# Patient Record
Sex: Male | Born: 1985 | Hispanic: Yes | Smoking: Never smoker
Health system: Southern US, Community
[De-identification: ages and names within clinical notes are randomized; demographics above are authoritative.]

## PROBLEM LIST (undated history)

## (undated) DIAGNOSIS — N200 Calculus of kidney: Secondary | ICD-10-CM

## (undated) DIAGNOSIS — L0291 Cutaneous abscess, unspecified: Secondary | ICD-10-CM

## (undated) HISTORY — PX: APPENDECTOMY: SHX54

---

## 2014-12-12 ENCOUNTER — Encounter (HOSPITAL_COMMUNITY): Payer: Self-pay | Admitting: Emergency Medicine

## 2014-12-12 DIAGNOSIS — T814XXA Infection following a procedure, initial encounter: Secondary | ICD-10-CM | POA: Diagnosis not present

## 2014-12-12 DIAGNOSIS — K750 Abscess of liver: Secondary | ICD-10-CM | POA: Diagnosis not present

## 2014-12-12 DIAGNOSIS — K352 Acute appendicitis with generalized peritonitis: Principal | ICD-10-CM | POA: Diagnosis present

## 2014-12-12 DIAGNOSIS — A419 Sepsis, unspecified organism: Secondary | ICD-10-CM | POA: Diagnosis not present

## 2014-12-12 DIAGNOSIS — K381 Appendicular concretions: Secondary | ICD-10-CM | POA: Diagnosis present

## 2014-12-12 DIAGNOSIS — Z6838 Body mass index (BMI) 38.0-38.9, adult: Secondary | ICD-10-CM

## 2014-12-12 DIAGNOSIS — E669 Obesity, unspecified: Secondary | ICD-10-CM | POA: Diagnosis present

## 2014-12-12 NOTE — ED Notes (Signed)
C/o RLQ pain since yesterday.  Denies vomiting/diarrhea.

## 2014-12-13 ENCOUNTER — Inpatient Hospital Stay (HOSPITAL_COMMUNITY)
Admission: EM | Admit: 2014-12-13 | Discharge: 2014-12-28 | DRG: 335 | Disposition: A | Discharge status: Home / Self Care | Payer: Self-pay | Attending: General Surgery | Admitting: General Surgery

## 2014-12-13 ENCOUNTER — Observation Stay (HOSPITAL_COMMUNITY): Payer: Self-pay | Admitting: Certified Registered"

## 2014-12-13 ENCOUNTER — Emergency Department (HOSPITAL_COMMUNITY): Payer: Self-pay

## 2014-12-13 ENCOUNTER — Encounter (HOSPITAL_COMMUNITY): Admission: EM | Disposition: A | Discharge status: Home / Self Care | Payer: Self-pay | Source: Home / Self Care

## 2014-12-13 ENCOUNTER — Observation Stay (HOSPITAL_COMMUNITY): Payer: MEDICAID | Admitting: Certified Registered"

## 2014-12-13 ENCOUNTER — Encounter (HOSPITAL_COMMUNITY): Payer: Self-pay | Admitting: Radiology

## 2014-12-13 DIAGNOSIS — K567 Ileus, unspecified: Secondary | ICD-10-CM | POA: Diagnosis not present

## 2014-12-13 DIAGNOSIS — K3532 Acute appendicitis with perforation and localized peritonitis, without abscess: Secondary | ICD-10-CM | POA: Diagnosis present

## 2014-12-13 DIAGNOSIS — K352 Acute appendicitis with generalized peritonitis, without abscess: Secondary | ICD-10-CM

## 2014-12-13 DIAGNOSIS — R188 Other ascites: Secondary | ICD-10-CM | POA: Insufficient documentation

## 2014-12-13 DIAGNOSIS — K9189 Other postprocedural complications and disorders of digestive system: Secondary | ICD-10-CM

## 2014-12-13 HISTORY — PX: LAPAROSCOPIC APPENDECTOMY: SHX408

## 2014-12-13 LAB — CBC
HCT: 43.5 % (ref 39.0–52.0)
HEMATOCRIT: 47.1 % (ref 39.0–52.0)
HEMOGLOBIN: 16.3 g/dL (ref 13.0–17.0)
Hemoglobin: 14.7 g/dL (ref 13.0–17.0)
MCH: 27.2 pg (ref 26.0–34.0)
MCH: 27.6 pg (ref 26.0–34.0)
MCHC: 33.8 g/dL (ref 30.0–36.0)
MCHC: 34.6 g/dL (ref 30.0–36.0)
MCV: 79.8 fL (ref 78.0–100.0)
MCV: 80.4 fL (ref 78.0–100.0)
PLATELETS: 185 10*3/uL (ref 150–400)
Platelets: 209 10*3/uL (ref 150–400)
RBC: 5.41 MIL/uL (ref 4.22–5.81)
RBC: 5.9 MIL/uL — ABNORMAL HIGH (ref 4.22–5.81)
RDW: 12.8 % (ref 11.5–15.5)
RDW: 12.9 % (ref 11.5–15.5)
WBC: 17.1 10*3/uL — ABNORMAL HIGH (ref 4.0–10.5)
WBC: 17.3 10*3/uL — ABNORMAL HIGH (ref 4.0–10.5)

## 2014-12-13 LAB — COMPREHENSIVE METABOLIC PANEL
ALBUMIN: 4.6 g/dL (ref 3.5–5.0)
ALK PHOS: 59 U/L (ref 38–126)
ALT: 40 U/L (ref 17–63)
ANION GAP: 9 (ref 5–15)
AST: 23 U/L (ref 15–41)
BILIRUBIN TOTAL: 0.9 mg/dL (ref 0.3–1.2)
BUN: 8 mg/dL (ref 6–20)
CO2: 26 mmol/L (ref 22–32)
Calcium: 9.6 mg/dL (ref 8.9–10.3)
Chloride: 98 mmol/L — ABNORMAL LOW (ref 101–111)
Creatinine, Ser: 0.77 mg/dL (ref 0.61–1.24)
GFR calc Af Amer: >60 mL/min (ref >60)
GFR calc non Af Amer: >60 mL/min (ref >60)
Glucose, Bld: 135 mg/dL — ABNORMAL HIGH (ref 65–99)
POTASSIUM: 4.1 mmol/L (ref 3.5–5.1)
SODIUM: 133 mmol/L — AB (ref 135–145)
TOTAL PROTEIN: 7.8 g/dL (ref 6.5–8.1)

## 2014-12-13 LAB — URINE MICROSCOPIC-ADD ON

## 2014-12-13 LAB — URINALYSIS, ROUTINE W REFLEX MICROSCOPIC
BILIRUBIN URINE: NEGATIVE
GLUCOSE, UA: NEGATIVE mg/dL
KETONES UR: NEGATIVE mg/dL
Leukocytes, UA: NEGATIVE
NITRITE: NEGATIVE
PH: 5.5 (ref 5.0–8.0)
Protein, ur: NEGATIVE mg/dL
SPECIFIC GRAVITY, URINE: 1.031 — AB (ref 1.005–1.030)

## 2014-12-13 LAB — LIPASE, BLOOD: Lipase: 25 U/L (ref 11–51)

## 2014-12-13 LAB — SURGICAL PCR SCREEN
MRSA, PCR: NEGATIVE
Staphylococcus aureus: NEGATIVE

## 2014-12-13 SURGERY — APPENDECTOMY, LAPAROSCOPIC
Anesthesia: General | Site: Abdomen

## 2014-12-13 MED ORDER — BUPIVACAINE-EPINEPHRINE (PF) 0.25% -1:200000 IJ SOLN
INTRAMUSCULAR | Status: AC
  Filled 2014-12-13: qty 30

## 2014-12-13 MED ORDER — PROPOFOL 10 MG/ML IV BOLUS
INTRAVENOUS | Status: AC
  Filled 2014-12-13: qty 20

## 2014-12-13 MED ORDER — LACTATED RINGERS IV SOLN: INTRAVENOUS | Status: DC

## 2014-12-13 MED ORDER — MIDAZOLAM HCL 5 MG/5ML IJ SOLN
INTRAMUSCULAR | Status: DC | PRN
  Administered 2014-12-13: 2 mg via INTRAVENOUS

## 2014-12-13 MED ORDER — MORPHINE SULFATE (PF) 2 MG/ML IV SOLN
1.0000 mg | INTRAVENOUS | Status: DC | PRN
  Administered 2014-12-13: 2 mg via INTRAVENOUS
  Administered 2014-12-13: 4 mg via INTRAVENOUS
  Administered 2014-12-14 – 2014-12-18 (×4): 2 mg via INTRAVENOUS
  Administered 2014-12-20: 4 mg via INTRAVENOUS
  Filled 2014-12-13 (×3): qty 1
  Filled 2014-12-13: qty 2
  Filled 2014-12-13: qty 1
  Filled 2014-12-13: qty 2
  Filled 2014-12-13: qty 1

## 2014-12-13 MED ORDER — ACETAMINOPHEN 500 MG PO TABS
1000.0000 mg | ORAL_TABLET | Freq: Once | ORAL | Status: AC
  Administered 2014-12-13: 1000 mg via ORAL
  Filled 2014-12-13: qty 2

## 2014-12-13 MED ORDER — PIPERACILLIN-TAZOBACTAM 3.375 G IVPB
3.3750 g | Freq: Three times a day (TID) | INTRAVENOUS | Status: DC
  Administered 2014-12-13 – 2014-12-18 (×15): 3.375 g via INTRAVENOUS
  Filled 2014-12-13 (×17): qty 50

## 2014-12-13 MED ORDER — ROCURONIUM BROMIDE 100 MG/10ML IV SOLN
INTRAVENOUS | Status: DC | PRN
  Administered 2014-12-13: 20 mg via INTRAVENOUS
  Administered 2014-12-13: 10 mg via INTRAVENOUS
  Administered 2014-12-13: 40 mg via INTRAVENOUS
  Administered 2014-12-13: 30 mg via INTRAVENOUS

## 2014-12-13 MED ORDER — ONDANSETRON 4 MG PO TBDP
4.0000 mg | ORAL_TABLET | Freq: Four times a day (QID) | ORAL | Status: DC | PRN
Start: 2014-12-13 — End: 2014-12-28

## 2014-12-13 MED ORDER — ACETAMINOPHEN 325 MG PO TABS
650.0000 mg | ORAL_TABLET | Freq: Four times a day (QID) | ORAL | Status: DC | PRN
  Administered 2014-12-13 – 2014-12-16 (×3): 650 mg via ORAL
  Filled 2014-12-13 (×3): qty 2

## 2014-12-13 MED ORDER — LIDOCAINE HCL (CARDIAC) 20 MG/ML IV SOLN
INTRAVENOUS | Status: AC
Start: 2014-12-13 — End: 2014-12-13
  Filled 2014-12-13: qty 5

## 2014-12-13 MED ORDER — SODIUM CHLORIDE 0.9 % IV BOLUS (SEPSIS)
1000.0000 mL | Freq: Once | INTRAVENOUS | Status: AC
  Administered 2014-12-13: 1000 mL via INTRAVENOUS

## 2014-12-13 MED ORDER — ALBUMIN HUMAN 5 % IV SOLN
INTRAVENOUS | Status: AC
  Filled 2014-12-13: qty 250

## 2014-12-13 MED ORDER — GLYCOPYRROLATE 0.2 MG/ML IJ SOLN
INTRAMUSCULAR | Status: DC | PRN
  Administered 2014-12-13: 0.4 mg via INTRAVENOUS

## 2014-12-13 MED ORDER — ROCURONIUM BROMIDE 50 MG/5ML IV SOLN
INTRAVENOUS | Status: AC
  Filled 2014-12-13: qty 1

## 2014-12-13 MED ORDER — FENTANYL CITRATE (PF) 250 MCG/5ML IJ SOLN
INTRAMUSCULAR | Status: AC
  Filled 2014-12-13: qty 5

## 2014-12-13 MED ORDER — BUPIVACAINE-EPINEPHRINE 0.25% -1:200000 IJ SOLN
INTRAMUSCULAR | Status: DC | PRN
  Administered 2014-12-13: 13 mL

## 2014-12-13 MED ORDER — LIDOCAINE HCL (CARDIAC) 20 MG/ML IV SOLN
INTRAVENOUS | Status: DC | PRN
  Administered 2014-12-13: 70 mg via INTRAVENOUS

## 2014-12-13 MED ORDER — PROPOFOL 10 MG/ML IV BOLUS
INTRAVENOUS | Status: DC | PRN
  Administered 2014-12-13: 200 mg via INTRAVENOUS

## 2014-12-13 MED ORDER — LACTATED RINGERS IV SOLN
INTRAVENOUS | Status: DC | PRN
  Administered 2014-12-13 (×4): via INTRAVENOUS

## 2014-12-13 MED ORDER — PIPERACILLIN-TAZOBACTAM 3.375 G IVPB
3.3750 g | Freq: Three times a day (TID) | INTRAVENOUS | Status: DC
  Filled 2014-12-13 (×2): qty 50

## 2014-12-13 MED ORDER — OXYCODONE HCL 5 MG/5ML PO SOLN: 5.0000 mg | Freq: Once | ORAL | Status: DC | PRN

## 2014-12-13 MED ORDER — ALBUMIN HUMAN 5 % IV SOLN
INTRAVENOUS | Status: AC
  Administered 2014-12-13: 12.5 g
  Filled 2014-12-13: qty 250

## 2014-12-13 MED ORDER — IOHEXOL 300 MG/ML  SOLN
100.0000 mL | Freq: Once | INTRAMUSCULAR | Status: AC | PRN
  Administered 2014-12-13: 100 mL via INTRAVENOUS

## 2014-12-13 MED ORDER — ONDANSETRON HCL 4 MG/2ML IJ SOLN
4.0000 mg | Freq: Four times a day (QID) | INTRAMUSCULAR | Status: DC | PRN
  Administered 2014-12-13: 4 mg via INTRAVENOUS

## 2014-12-13 MED ORDER — HYDROMORPHONE HCL 1 MG/ML IJ SOLN: 0.2500 mg | INTRAMUSCULAR | Status: DC | PRN

## 2014-12-13 MED ORDER — FENTANYL CITRATE (PF) 100 MCG/2ML IJ SOLN
INTRAMUSCULAR | Status: DC | PRN
  Administered 2014-12-13: 50 ug via INTRAVENOUS
  Administered 2014-12-13 (×2): 100 ug via INTRAVENOUS

## 2014-12-13 MED ORDER — PIPERACILLIN-TAZOBACTAM 3.375 G IVPB 30 MIN
3.3750 g | Freq: Once | INTRAVENOUS | Status: AC
Start: 2014-12-13 — End: 2014-12-13
  Administered 2014-12-13: 3.375 g via INTRAVENOUS
  Filled 2014-12-13: qty 50

## 2014-12-13 MED ORDER — DIPHENHYDRAMINE HCL 50 MG/ML IJ SOLN: 25.0000 mg | Freq: Four times a day (QID) | INTRAMUSCULAR | Status: DC | PRN

## 2014-12-13 MED ORDER — NEOSTIGMINE METHYLSULFATE 10 MG/10ML IV SOLN
INTRAVENOUS | Status: DC | PRN
  Administered 2014-12-13: 3 mg via INTRAVENOUS

## 2014-12-13 MED ORDER — ENOXAPARIN SODIUM 40 MG/0.4ML ~~LOC~~ SOLN
40.0000 mg | SUBCUTANEOUS | Status: DC
  Administered 2014-12-13 – 2014-12-19 (×7): 40 mg via SUBCUTANEOUS
  Filled 2014-12-13 (×6): qty 0.4

## 2014-12-13 MED ORDER — ONDANSETRON HCL 4 MG/2ML IJ SOLN: 4.0000 mg | Freq: Four times a day (QID) | INTRAMUSCULAR | Status: DC | PRN

## 2014-12-13 MED ORDER — POTASSIUM CHLORIDE IN NACL 20-0.9 MEQ/L-% IV SOLN
INTRAVENOUS | Status: DC
  Administered 2014-12-13 – 2014-12-27 (×19): via INTRAVENOUS
  Filled 2014-12-13 (×27): qty 1000

## 2014-12-13 MED ORDER — MORPHINE SULFATE (PF) 4 MG/ML IV SOLN
6.0000 mg | Freq: Once | INTRAVENOUS | Status: AC
  Administered 2014-12-13: 6 mg via INTRAVENOUS
  Filled 2014-12-13: qty 2

## 2014-12-13 MED ORDER — 0.9 % SODIUM CHLORIDE (POUR BTL) OPTIME
TOPICAL | Status: DC | PRN
  Administered 2014-12-13: 1000 mL

## 2014-12-13 MED ORDER — ALBUMIN HUMAN 25 % IV SOLN
125.0000 g | Freq: Once | INTRAVENOUS | Status: AC
  Administered 2014-12-13: 125 g via INTRAVENOUS
  Filled 2014-12-13 (×2): qty 500

## 2014-12-13 MED ORDER — DIPHENHYDRAMINE HCL 25 MG PO CAPS: 25.0000 mg | ORAL_CAPSULE | Freq: Four times a day (QID) | ORAL | Status: DC | PRN

## 2014-12-13 MED ORDER — SODIUM CHLORIDE 0.9 % IR SOLN
Status: DC | PRN
  Administered 2014-12-13 (×3): 1000 mL

## 2014-12-13 MED ORDER — ALBUMIN HUMAN 5 % IV SOLN
25.0000 g | Freq: Once | INTRAVENOUS | Status: AC
  Administered 2014-12-13: 12.5 g via INTRAVENOUS

## 2014-12-13 MED ORDER — MIDAZOLAM HCL 2 MG/2ML IJ SOLN
INTRAMUSCULAR | Status: AC
  Filled 2014-12-13: qty 2

## 2014-12-13 MED ORDER — OXYCODONE HCL 5 MG PO TABS: 5.0000 mg | ORAL_TABLET | Freq: Once | ORAL | Status: DC | PRN

## 2014-12-13 SURGICAL SUPPLY — 50 items
APPLIER CLIP ROT 10 11.4 M/L (STAPLE)
BLADE SURG ROTATE 9660 (MISCELLANEOUS) ×3 IMPLANT
CANISTER SUCTION 2500CC (MISCELLANEOUS) ×3 IMPLANT
CATH FOLEY 2WAY  3CC  8FR (CATHETERS) ×2
CATH FOLEY 2WAY 3CC 8FR (CATHETERS) ×1 IMPLANT
CHLORAPREP W/TINT 26ML (MISCELLANEOUS) ×3 IMPLANT
CLIP APPLIE ROT 10 11.4 M/L (STAPLE) IMPLANT
COVER SURGICAL LIGHT HANDLE (MISCELLANEOUS) ×3 IMPLANT
CUTTER LINEAR ENDO 35 ETS (STAPLE) IMPLANT
DRAIN CHANNEL 19F RND (DRAIN) ×3 IMPLANT
ELECT REM PT RETURN 9FT ADLT (ELECTROSURGICAL) ×3
ELECTRODE REM PT RTRN 9FT ADLT (ELECTROSURGICAL) ×1 IMPLANT
EVACUATOR SILICONE 100CC (DRAIN) ×3 IMPLANT
GLOVE BIO SURGEON STRL SZ8 (GLOVE) ×6 IMPLANT
GLOVE BIOGEL PI IND STRL 7.0 (GLOVE) ×1 IMPLANT
GLOVE BIOGEL PI IND STRL 8 (GLOVE) ×2 IMPLANT
GLOVE BIOGEL PI INDICATOR 7.0 (GLOVE) ×2
GLOVE BIOGEL PI INDICATOR 8 (GLOVE) ×4
GLOVE SURG SS PI 7.5 STRL IVOR (GLOVE) ×6 IMPLANT
GOWN STRL REUS W/ TWL LRG LVL3 (GOWN DISPOSABLE) ×2 IMPLANT
GOWN STRL REUS W/ TWL XL LVL3 (GOWN DISPOSABLE) ×3 IMPLANT
GOWN STRL REUS W/TWL LRG LVL3 (GOWN DISPOSABLE) ×4
GOWN STRL REUS W/TWL XL LVL3 (GOWN DISPOSABLE) ×6
HANDLE UNIV ENDO GIA (ENDOMECHANICALS) ×3 IMPLANT
KIT BASIN OR (CUSTOM PROCEDURE TRAY) ×3 IMPLANT
KIT ROOM TURNOVER OR (KITS) ×3 IMPLANT
LIQUID BAND (GAUZE/BANDAGES/DRESSINGS) ×3 IMPLANT
NEEDLE 22X1 1/2 (OR ONLY) (NEEDLE) ×3 IMPLANT
NS IRRIG 1000ML POUR BTL (IV SOLUTION) ×3 IMPLANT
PAD ARMBOARD 7.5X6 YLW CONV (MISCELLANEOUS) ×6 IMPLANT
POUCH SPECIMEN RETRIEVAL 10MM (ENDOMECHANICALS) ×3 IMPLANT
RELOAD /EVU35 (ENDOMECHANICALS) IMPLANT
RELOAD BLUE (STAPLE) ×6 IMPLANT
RELOAD CUTTER ETS 35MM STAND (ENDOMECHANICALS) IMPLANT
RELOAD TRI 2.0 60 XTHK VAS SUL (STAPLE) ×3 IMPLANT
SCALPEL HARMONIC ACE (MISCELLANEOUS) ×3 IMPLANT
SCISSORS LAP 5X35 DISP (ENDOMECHANICALS) ×3 IMPLANT
SET IRRIG TUBING LAPAROSCOPIC (IRRIGATION / IRRIGATOR) ×3 IMPLANT
SPECIMEN JAR SMALL (MISCELLANEOUS) ×3 IMPLANT
STAPLER ECHELON FLEX (STAPLE) ×3 IMPLANT
SUT ETHILON 2 0 FS 18 (SUTURE) ×3 IMPLANT
SUT VIC AB 4-0 PS2 27 (SUTURE) ×3 IMPLANT
TOWEL OR 17X24 6PK STRL BLUE (TOWEL DISPOSABLE) IMPLANT
TOWEL OR 17X26 10 PK STRL BLUE (TOWEL DISPOSABLE) ×3 IMPLANT
TRAY FOLEY CATH 16FR SILVER (SET/KITS/TRAYS/PACK) ×3 IMPLANT
TRAY LAPAROSCOPIC MC (CUSTOM PROCEDURE TRAY) ×3 IMPLANT
TROCAR XCEL 12X100 BLDLESS (ENDOMECHANICALS) ×3 IMPLANT
TROCAR XCEL BLUNT TIP 100MML (ENDOMECHANICALS) ×3 IMPLANT
TROCAR XCEL NON-BLD 5MMX100MML (ENDOMECHANICALS) ×3 IMPLANT
TUBING INSUFFLATION (TUBING) ×3 IMPLANT

## 2014-12-13 NOTE — Anesthesia Postprocedure Evaluation (Signed)
  Anesthesia Post-op Note  Patient: Dale DieselJonathan Abarca Crane  Procedure(s) Performed: Procedure(s): APPENDECTOMY LAPAROSCOPIC FOR RUPTURED APPENDIX; LAPAROSCOPIC LYSIS OF ADHESIONS 45 minutes (N/A)  Patient Location: PACU  Anesthesia Type:General  Level of Consciousness: awake, alert  and oriented  Airway and Oxygen Therapy: Patient Spontanous Breathing and Patient connected to nasal cannula oxygen  Post-op Pain: Mild  Post-op Assessment: Post-op Vital signs reviewed, Patient's Cardiovascular Status Stable, Respiratory Function Stable, Patent Airway and Pain level controlled              Post-op Vital Signs: stable  Last Vitals:  Filed Vitals:   12/13/14 1654  BP:   Pulse:   Temp: 39.3 C  Resp:     Complications: No apparent anesthesia complications

## 2014-12-13 NOTE — H&P (Signed)
Dale Crane is an 29 y.o. male.   Chief Complaint: Right lower quadrant abdominal pain HPI: This is a pleasant Hispanic gentleman who presents with a three-day history of sharp right lower quadrant abdominal pain and fever. The pain came on gradually. In the emergency department he was found to be tachycardic and had a fever of 102. He reports having had an appendectomy in [place] approximately 3 years ago. One month after that surgery he required reoperation. He denies nausea or vomiting. Bowel movements a been normal. He is otherwise healthy without complaints.  History reviewed. No pertinent past medical history.  Past Surgical History  Procedure Laterality Date  . Appendectomy      No family history on file. Social History:  reports that he has never smoked. He does not have any smokeless tobacco history on file. He reports that he drinks alcohol. He reports that he does not use illicit drugs.  Allergies: No Known Allergies  No prescriptions prior to admission    Results for orders placed or performed during the hospital encounter of 12/13/14 (from the past 48 hour(s))  Urinalysis, Routine w reflex microscopic (not at [facility])     Status: Abnormal   Collection Time: 12/13/14 12:06 AM  Result Value Ref Range   Color, Urine AMBER (A) YELLOW    Comment: BIOCHEMICALS MAY BE AFFECTED BY COLOR   APPearance CLEAR CLEAR   Specific Gravity, Urine 1.031 (H) 1.005 - 1.030   pH 5.5 5.0 - 8.0   Glucose, UA NEGATIVE NEGATIVE mg/dL   Hgb urine dipstick TRACE (A) NEGATIVE   Bilirubin Urine NEGATIVE NEGATIVE   Ketones, ur NEGATIVE NEGATIVE mg/dL   Protein, ur NEGATIVE NEGATIVE mg/dL   Nitrite NEGATIVE NEGATIVE   Leukocytes, UA NEGATIVE NEGATIVE  Urine microscopic-add on     Status: Abnormal   Collection Time: 12/13/14 12:06 AM  Result Value Ref Range   Squamous Epithelial / LPF 0-5 (A) NONE SEEN    Comment: Please note change in reference range.   WBC, UA 0-5 0 - 5 WBC/hpf    Comment:  Please note change in reference range.   RBC / HPF 0-5 0 - 5 RBC/hpf    Comment: Please note change in reference range.   Bacteria, UA RARE (A) NONE SEEN    Comment: Please note change in reference range.   Urine-Other MUCOUS PRESENT   Lipase, blood     Status: None   Collection Time: 12/13/14 12:24 AM  Result Value Ref Range   Lipase 25 11 - 51 U/L  Comprehensive metabolic panel     Status: Abnormal   Collection Time: 12/13/14 12:24 AM  Result Value Ref Range   Sodium 133 (L) 135 - 145 mmol/L   Potassium 4.1 3.5 - 5.1 mmol/L   Chloride 98 (L) 101 - 111 mmol/L   CO2 26 22 - 32 mmol/L   Glucose, Bld 135 (H) 65 - 99 mg/dL   BUN 8 6 - 20 mg/dL   Creatinine, Ser 0.77 0.61 - 1.24 mg/dL   Calcium 9.6 8.9 - 10.3 mg/dL   Total Protein 7.8 6.5 - 8.1 g/dL   Albumin 4.6 3.5 - 5.0 g/dL   AST 23 15 - 41 U/L   ALT 40 17 - 63 U/L   Alkaline Phosphatase 59 38 - 126 U/L   Total Bilirubin 0.9 0.3 - 1.2 mg/dL   GFR calc non Af Amer >60 >60 mL/min   GFR calc Af Amer >60 >60 mL/min  Comment: (NOTE) The eGFR has been calculated using the CKD EPI equation. This calculation has not been validated in all clinical situations. eGFR's persistently <60 mL/min signify possible Chronic Kidney Disease.    Anion gap 9 5 - 15  CBC     Status: Abnormal   Collection Time: 12/13/14 12:24 AM  Result Value Ref Range   WBC 17.3 (H) 4.0 - 10.5 K/uL   RBC 5.90 (H) 4.22 - 5.81 MIL/uL   Hemoglobin 16.3 13.0 - 17.0 g/dL   HCT 47.1 39.0 - 52.0 %   MCV 79.8 78.0 - 100.0 fL   MCH 27.6 26.0 - 34.0 pg   MCHC 34.6 30.0 - 36.0 g/dL   RDW 12.8 11.5 - 15.5 %   Platelets 209 150 - 400 K/uL   Ct Abdomen Pelvis W Contrast  12/13/2014  CLINICAL DATA:  Right lower quadrant pain since yesterday, fever. Patient reports history of prior appendectomy and surgery to deal with infection in [place] earlier this year. EXAM: CT ABDOMEN AND PELVIS WITH CONTRAST TECHNIQUE: Multidetector CT imaging of the abdomen and pelvis was  performed using the standard protocol following bolus administration of intravenous contrast. CONTRAST:  170m OMNIPAQUE IOHEXOL 300 MG/ML  SOLN COMPARISON:  None. FINDINGS: Lower chest: Linear subsegmental atelectasis in the right lower lobe and lingula. Liver: Decreased density consistent with steatosis, no focal lesion. Hepatobiliary: Gallbladder physiologically distended, no calcified gallstone. No biliary dilatation. Pancreas: Normal. Spleen: Normal. Adrenal glands: No nodule. Kidneys: Symmetric renal enhancement.  No hydronephrosis. Stomach/Bowel: Stomach physiologically distended. There are no dilated or thickened small bowel loops. Small volume of stool throughout the colon. Within the right lower quadrant is a dilated blind-ending tubular structure arising from the cecum measuring up to 2.4 cm containing intraluminal high-density material. There is moderate adjacent fat stranding. Small amount of free fluid in the pericolic gutter. There is adjacent thickening at the bases cecum. There are multiple prominent ileocolic lymph nodes, up to 9 mm short axis dimension. Vascular/Lymphatic: No retroperitoneal adenopathy. Abdominal aorta is normal in caliber. Reproductive: Prostate gland normal in size. Bladder: Physiologically distended. Other: No free air or abscess. Postsurgical change in the anterior abdominal wall. Musculoskeletal: There are no acute or suspicious osseous abnormalities. Incidental note of 6 non-rib-bearing lumbar vertebra. IMPRESSION: Despite patient reported history appendectomy, the blind-ending tubular structure with intraluminal hyperdensity arising from the cecum is most consistent with acute appendicitis. There is a moderate amount of surrounding fat stranding and small volume of free fluid in the right lower quadrant. Multiple prominent lymph nodes in the ileocolic chain, likely reactive. These results were called by telephone at the time of interpretation on 12/13/2014 at 2:31 am to Dr.  AEverlene Balls, who verbally acknowledged these results. Electronically Signed   By: MJeb LeveringM.D.   On: 12/13/2014 02:33    Review of Systems  All other systems reviewed and are negative.   Blood pressure 114/41, pulse 104, temperature 99 F (37.2 C), temperature source Axillary, resp. rate 20, height 5' 8"  (1.727 m), weight 97.8 kg (215 lb 9.8 oz), SpO2 98 %. Physical Exam  Constitutional: He is oriented to person, place, and time. He appears well-developed and well-nourished. No distress.  HENT:  Head: Normocephalic and atraumatic.  Right Ear: External ear normal.  Left Ear: External ear normal.  Nose: Nose normal.  Mouth/Throat: Oropharynx is clear and moist. No oropharyngeal exudate.  Eyes: Conjunctivae are normal. Pupils are equal, round, and reactive to light. No scleral icterus.  Neck: Normal  range of motion. Neck supple. No tracheal deviation present.  Cardiovascular: Regular rhythm, normal heart sounds and intact distal pulses.   No murmur heard. Tachycardic  Respiratory: Effort normal and breath sounds normal. No respiratory distress. He has no wheezes.  GI: Soft. There is tenderness.  He has a well-healed midline incision and a much smaller incision in the right lower quadrant. He has tenderness in the right lower quadrant which is closer to the flank  Musculoskeletal: Normal range of motion. He exhibits no edema or tenderness.  Lymphadenopathy:    He has no cervical adenopathy.  Neurological: He is alert and oriented to person, place, and time.  Skin: Skin is warm. No rash noted. He is diaphoretic.  Psychiatric: His behavior is normal. Judgment normal.     Assessment/Plan Acute appendicitis  Certainly, the CT findings appear to be consistent with appendicitis. This may be stump appendicitis. There may be a large appendicolith on the CT scan. Currently, we will continue bowel rest and IV antibiotics. We may need to get his records from [place] to help determine  the next step and whether surgery will be necessary.  Kynzi Levay A 12/13/2014, 6:16 AM

## 2014-12-13 NOTE — Anesthesia Preprocedure Evaluation (Signed)
Anesthesia Evaluation  Patient identified by MRN, date of birth, ID band Patient awake    Reviewed: Allergy & Precautions, NPO status , Patient's Chart, lab work & pertinent test results  Airway Mallampati: II   Neck ROM: full    Dental   Pulmonary neg pulmonary ROS,    breath sounds clear to auscultation       Cardiovascular negative cardio ROS   Rhythm:regular Rate:Normal     Neuro/Psych    GI/Hepatic   Endo/Other  obese  Renal/GU      Musculoskeletal   Abdominal   Peds  Hematology   Anesthesia Other Findings   Reproductive/Obstetrics                             Anesthesia Physical Anesthesia Plan  ASA: I  Anesthesia Plan: General   Post-op Pain Management:    Induction: Intravenous  Airway Management Planned: Oral ETT  Additional Equipment:   Intra-op Plan:   Post-operative Plan: Extubation in OR  Informed Consent: I have reviewed the patients History and Physical, chart, labs and discussed the procedure including the risks, benefits and alternatives for the proposed anesthesia with the patient or authorized representative who has indicated his/her understanding and acceptance.     Plan Discussed with: CRNA, Anesthesiologist and Surgeon  Anesthesia Plan Comments:         Anesthesia Quick Evaluation

## 2014-12-13 NOTE — Transfer of Care (Signed)
Immediate Anesthesia Transfer of Care Note  Patient: Dale Crane  Procedure(s) Performed: Procedure(s): APPENDECTOMY LAPAROSCOPIC FOR RUPTURED APPENDIX; LAPAROSCOPIC LYSIS OF ADHESIONS 45 minutes (N/A)  Patient Location: PACU  Anesthesia Type:General  Level of Consciousness: awake, alert  and oriented  Airway & Oxygen Therapy: Patient Spontanous Breathing  Post-op Assessment: Report given to RN and Post -op Vital signs reviewed and stable  Post vital signs: Reviewed and stable  Last Vitals:  Filed Vitals:   12/13/14 1106  BP: 113/62  Pulse: 119  Temp: 39.6 C  Resp: 20    Complications: No apparent anesthesia complications

## 2014-12-13 NOTE — Progress Notes (Signed)
Day of Surgery  Subjective: RLQ pain  Objective: Vital signs in last 24 hours: Temp:  [98.4 F (36.9 C)-102.6 F (39.2 C)] 98.4 F (36.9 C) (11/17 0615) Pulse Rate:  [104-117] 107 (11/17 0615) Resp:  [15-22] 20 (11/17 0615) BP: (100-128)/(41-85) 106/73 mmHg (11/17 0615) SpO2:  [94 %-100 %] 100 % (11/17 0615) Weight:  [97.8 kg (215 lb 9.8 oz)-113.853 kg (251 lb)] 97.8 kg (215 lb 9.8 oz) (11/17 0334)    Intake/Output from previous day:   Intake/Output this shift:    General appearance: alert and cooperative Resp: clear to auscultation bilaterally Cardio: regular rate and rhythm GI: soft, tender RLQ with voluntary guarding  Lab Results:   Recent Labs  12/13/14 0024 12/13/14 0730  WBC 17.3* 17.1*  HGB 16.3 14.7  HCT 47.1 43.5  PLT 209 185   BMET  Recent Labs  12/13/14 0024  NA 133*  K 4.1  CL 98*  CO2 26  GLUCOSE 135*  BUN 8  CREATININE 0.77  CALCIUM 9.6   PT/INR No results for input(s): LABPROT, INR in the last 72 hours. ABG No results for input(s): PHART, HCO3 in the last 72 hours.  Invalid input(s): PCO2, PO2  Studies/Results: Ct Abdomen Pelvis W Contrast  12/13/2014  CLINICAL DATA:  Right lower quadrant pain since yesterday, fever. Patient reports history of prior appendectomy and surgery to deal with infection in [place] earlier this year. EXAM: CT ABDOMEN AND PELVIS WITH CONTRAST TECHNIQUE: Multidetector CT imaging of the abdomen and pelvis was performed using the standard protocol following bolus administration of intravenous contrast. CONTRAST:  100mL OMNIPAQUE IOHEXOL 300 MG/ML  SOLN COMPARISON:  None. FINDINGS: Lower chest: Linear subsegmental atelectasis in the right lower lobe and lingula. Liver: Decreased density consistent with steatosis, no focal lesion. Hepatobiliary: Gallbladder physiologically distended, no calcified gallstone. No biliary dilatation. Pancreas: Normal. Spleen: Normal. Adrenal glands: No nodule. Kidneys: Symmetric renal  enhancement.  No hydronephrosis. Stomach/Bowel: Stomach physiologically distended. There are no dilated or thickened small bowel loops. Small volume of stool throughout the colon. Within the right lower quadrant is a dilated blind-ending tubular structure arising from the cecum measuring up to 2.4 cm containing intraluminal high-density material. There is moderate adjacent fat stranding. Small amount of free fluid in the pericolic gutter. There is adjacent thickening at the bases cecum. There are multiple prominent ileocolic lymph nodes, up to 9 mm short axis dimension. Vascular/Lymphatic: No retroperitoneal adenopathy. Abdominal aorta is normal in caliber. Reproductive: Prostate gland normal in size. Bladder: Physiologically distended. Other: No free air or abscess. Postsurgical change in the anterior abdominal wall. Musculoskeletal: There are no acute or suspicious osseous abnormalities. Incidental note of 6 non-rib-bearing lumbar vertebra. IMPRESSION: Despite patient reported history appendectomy, the blind-ending tubular structure with intraluminal hyperdensity arising from the cecum is most consistent with acute appendicitis. There is a moderate amount of surrounding fat stranding and small volume of free fluid in the right lower quadrant. Multiple prominent lymph nodes in the ileocolic chain, likely reactive. These results were called by telephone at the time of interpretation on 12/13/2014 at 2:31 am to Dr. Tomasita CrumbleADELEKE ONI , who verbally acknowledged these results. Electronically Signed   By: Rubye OaksMelanie  Ehinger M.D.   On: 12/13/2014 02:33    Anti-infectives: Anti-infectives    Start     Dose/Rate Route Frequency Ordered Stop   12/13/14 1000  piperacillin-tazobactam (ZOSYN) IVPB 3.375 g     3.375 g 12.5 mL/hr over 240 Minutes Intravenous 3 times per day 12/13/14 0345  12/13/14 0600  piperacillin-tazobactam (ZOSYN) IVPB 3.375 g  Status:  Discontinued     3.375 g 12.5 mL/hr over 240 Minutes Intravenous 3  times per day 12/13/14 0332 12/13/14 0345   12/13/14 0245  piperacillin-tazobactam (ZOSYN) IVPB 3.375 g     3.375 g 100 mL/hr over 30 Minutes Intravenous  Once 12/13/14 0230 12/13/14 1610      Assessment/Plan: Acute appendicitis - to OR for laparoscopic, possible open appendectomy. Procedure, risks, and benefits D/W him using the translator phone. He agrees. I do not know exactly what was done in [place] when he had 2 surgeries and he thought an appendectomy. He clearly has an inflamed appendix on CT and his exam is consistent with this. ID - continue Zosyn VTE - Lovenox     Jolaine Fryberger E 12/13/2014

## 2014-12-13 NOTE — Op Note (Signed)
12/13/2014  2:08 PM  PATIENT:  Dale Crane  29 y.o. male  PRE-OPERATIVE DIAGNOSIS:  Perforated appendicitis  POST-OPERATIVE DIAGNOSIS:  Perforated appendicitis  PROCEDURE:  Procedure(s): APPENDECTOMY LAPAROSCOPIC FOR RUPTURED APPENDIX LAPAROSCOPIC LYSIS OF ADHESIONS 45 MINUTES  SURGEON:  Surgeon(s): Violeta GelinasBurke Oaklie Durrett, MD  ASSISTANTS: none   ANESTHESIA:   local and general  EBL:  Total I/O In: 2050 [I.V.:2000; IV Piggyback:50] Out: 200 [Urine:200]  BLOOD ADMINISTERED:none  DRAINS: (1) Jackson-Pratt drain(s) with closed bulb suction in the RLQ   SPECIMEN:  Excision  DISPOSITION OF SPECIMEN:  PATHOLOGY  COUNTS:  YES  DICTATION: .Dragon Dictation Findings: Acute appendicitis with localized perforation into the lateral abdominal wall with severe chronic inflammation  Procedure in detail: Christiane Crane reports that he had an appendectomy in [place] 3 years ago followed by a laparotomy for infectious complications. He presented to the emergency department with right lower quadrant abdominal pain. CT scan the abdomen and pelvis here demonstrates acute appendicitis with visible appendix containing appendicolith. He is brought for appendectomy. Informed consent was obtained. He is receiving intravenous antibiotics. He was brought to the operating room and general endotracheal anesthesia was administered by the anesthesia staff. Foley catheter was placed. His abdomen was prepped and draped in sterile fashion. Time out procedure was performed. Upper midline area was infiltrated with local above his old scar. Incision was made and subcutaneous tissues were dissected down revealing the anterior fascia. This was divided sharply along the midline. Peritoneal cavity was entered under direct vision without complication. A 0 Vicryl pursestring was placed around the fascial opening. Hassan trocar was inserted into the abdomen. The abdomen was insufflated with carbon dioxide in standard fashion.  Under direct vision a 5 mm right lateral port was placed. A 12 mm right lower quadrant port was placed. Local was used at each port site. There were several filmy adhesions from his previous surgery and involving the right colon. These were gradually taken down. Total adhesiolysis was 45 minutes. The cecum was chronically inflamed at the lateral portion. The terminal ileum had been freed up during the adhesiolysis. I gradually began mobilizing the right colon from the lateral peritoneal attachments. There was a very hard region lateral to the cecum. Gradual mobilization of the cecum revealed the appendix stuck over there with a localized perforation. It was freed up and the mesoappendix was taken down with the harmonic scalpel achieving good hemostasis. The base was quite broad so I selected an Echelon endo GIA stapler. This was using the base was divided right at the cecum. Appendix was placed in a bag and removed from the right lower quadrant port site. Staple line was intact. The abdomen was then copiously irrigated with 3 L of saline. Irrigation fluid returned clear. Hemostasis was ensured. A 19 [place] drain was placed via the right mid abdomen port site and positioned down into the right lower quadrant. This was secured with drain stitch. Remainder of irrigation fluid was evacuated. Ports were removed under direct vision. Pneumoperitoneum was released. Upper midline fascia was closed by tying the pursestring. The 2 remaining wounds were irrigated and closed with running 4 Vicryl subcuticular followed by liquid plan. All counts were correct. He tolerated procedure well without apparent complications taken recovery in stable condition.  PATIENT DISPOSITION:  PACU - hemodynamically stable.   Delay start of Pharmacological VTE agent (>24hrs) due to surgical blood loss or risk of bleeding:  no  Violeta GelinasBurke Yaslin Kirtley, MD, MPH, FACS Pager: [phone]  11/17/20162:08 PM

## 2014-12-13 NOTE — Anesthesia Procedure Notes (Signed)
Procedure Name: Intubation Date/Time: 12/13/2014 12:09 PM Performed by: Arlice ColtMANESS, Hamsini Verrilli B Pre-anesthesia Checklist: Patient identified, Emergency Drugs available, Suction available, Patient being monitored and Timeout performed Patient Re-evaluated:Patient Re-evaluated prior to inductionOxygen Delivery Method: Circle system utilized Preoxygenation: Pre-oxygenation with 100% oxygen Intubation Type: IV induction Ventilation: Mask ventilation without difficulty and Oral airway inserted - appropriate to patient size Laryngoscope Size: Mac and 3 Grade View: Grade III Tube type: Oral Tube size: 7.5 mm Number of attempts: 1 Airway Equipment and Method: Stylet Placement Confirmation: ETT inserted through vocal cords under direct vision,  positive ETCO2 and breath sounds checked- equal and bilateral Secured at: 22 cm Tube secured with: Tape Dental Injury: Teeth and Oropharynx as per pre-operative assessment

## 2014-12-13 NOTE — ED Notes (Signed)
Pt transported to radiology.

## 2014-12-13 NOTE — ED Notes (Signed)
Dr. Mora Bellmanni aware of temperature.

## 2014-12-13 NOTE — ED Provider Notes (Signed)
CSN: [id number]     Arrival date & time 12/12/14  2311 History  By signing my name below, I, Dale Crane, attest that this documentation has been prepared under the direction and in the presence of Tomasita Crumble, MD. Electronically Signed: Doreatha Crane, ED Scribe. 12/13/2014. 1:33 AM.    Chief Complaint  Patient presents with  . Abdominal Pain   The history is provided by the patient. The history is limited by a language barrier. No language interpreter was used.    LEVEL 5 CAVEAT: HPI and ROS limited due to language barrier.    HPI Comments: Dale Crane is a 29 y.o. male who presents to the Emergency Department complaining of moderate RLQ abdominal pain for 3 days with associated fever. No treatments for pain tried PTA. H/o appendectomy 3 yrs ago and additional surgery a month later due to a complication. Fever of 100.6 on exam. He denies vomiting, diarrhea, dysuria, hematuria.    History reviewed. No pertinent past medical history. Past Surgical History  Procedure Laterality Date  . Appendectomy     No family history on file. Social History  Substance Use Topics  . Smoking status: Never Smoker   . Smokeless tobacco: None  . Alcohol Use: Yes    Review of Systems 10 Systems reviewed and are negative for acute change except as noted in the HPI.   Allergies  Review of patient's allergies indicates not on file.  Home Medications   Prior to Admission medications   Not on File   BP 128/66 mmHg  Pulse 109  Temp(Src) 102.6 F (39.2 C) (Oral)  Resp 19  Ht  (1.727 m)  Wt 251 lb (113.853 kg)  BMI 38.17 kg/m2  SpO2 95% Physical Exam  Constitutional: He is oriented to person, place, and time. Vital signs are normal. He appears well-developed and well-nourished.  Non-toxic appearance. He does not appear ill. No distress.  Tactile fever. Diaphoretic.   HENT:  Head: Normocephalic and atraumatic.  Nose: Nose normal.  Mouth/Throat: Oropharynx is clear and moist. No  oropharyngeal exudate.  Eyes: Conjunctivae and EOM are normal. Pupils are equal, round, and reactive to light. No scleral icterus.  Neck: Normal range of motion. Neck supple. No tracheal deviation, no edema, no erythema and normal range of motion present. No thyroid mass and no thyromegaly present.  Cardiovascular: Regular rhythm, S1 normal, S2 normal, normal heart sounds, intact distal pulses and normal pulses.  Tachycardia present.  Exam reveals no gallop and no friction rub.   No murmur heard. Tachycardic.   Pulmonary/Chest: Effort normal and breath sounds normal. No respiratory distress. He has no wheezes. He has no rhonchi. He has no rales.  Abdominal: Soft. Normal appearance and bowel sounds are normal. He exhibits no distension, no ascites and no mass. There is no hepatosplenomegaly. There is tenderness. There is guarding. There is no rebound and no CVA tenderness.  Suprapubic and RLQ tenderness with guarding.   Musculoskeletal: Normal range of motion. He exhibits no edema or tenderness.  Lymphadenopathy:    He has no cervical adenopathy.  Neurological: He is alert and oriented to person, place, and time. He has normal strength. No cranial nerve deficit or sensory deficit.  Skin: Skin is warm, dry and intact. No petechiae and no rash noted. He is not diaphoretic. No erythema. No pallor.  Psychiatric: He has a normal mood and affect. His behavior is normal. Judgment normal.  Nursing note and vitals reviewed.  ED Course  Procedures (including critical  care time) DIAGNOSTIC STUDIES: Oxygen Saturation is 98% on RA, normal by my interpretation.    COORDINATION OF CARE: 1:12 AM Discussed treatment plan with pt at bedside and pt agreed to plan.   Labs Review Labs Reviewed  COMPREHENSIVE METABOLIC PANEL - Abnormal; Notable for the following:    Sodium 133 (*)    Chloride 98 (*)    Glucose, Bld 135 (*)    All other components within normal limits  CBC - Abnormal; Notable for the  following:    WBC 17.3 (*)    RBC 5.90 (*)    All other components within normal limits  URINALYSIS, ROUTINE W REFLEX MICROSCOPIC (NOT AT [facility]) - Abnormal; Notable for the following:    Color, Urine AMBER (*)    Specific Gravity, Urine 1.031 (*)    Hgb urine dipstick TRACE (*)    All other components within normal limits  URINE MICROSCOPIC-ADD ON - Abnormal; Notable for the following:    Squamous Epithelial / LPF 0-5 (*)    Bacteria, UA RARE (*)    All other components within normal limits  LIPASE, BLOOD    Imaging Review Ct Abdomen Pelvis W Contrast  12/13/2014  CLINICAL DATA:  Right lower quadrant pain since yesterday, fever. Patient reports history of prior appendectomy and surgery to deal with infection in [place] earlier this year. EXAM: CT ABDOMEN AND PELVIS WITH CONTRAST TECHNIQUE: Multidetector CT imaging of the abdomen and pelvis was performed using the standard protocol following bolus administration of intravenous contrast. CONTRAST:  OMNIPAQUE IOHEXOL 300 MG/ML  SOLN COMPARISON:  None. FINDINGS: Lower chest: Linear subsegmental atelectasis in the right lower lobe and lingula. Liver: Decreased density consistent with steatosis, no focal lesion. Hepatobiliary: Gallbladder physiologically distended, no calcified gallstone. No biliary dilatation. Pancreas: Normal. Spleen: Normal. Adrenal glands: No nodule. Kidneys: Symmetric renal enhancement.  No hydronephrosis. Stomach/Bowel: Stomach physiologically distended. There are no dilated or thickened small bowel loops. Small volume of stool throughout the colon. Within the right lower quadrant is a dilated blind-ending tubular structure arising from the cecum measuring up to 2.4 cm containing intraluminal high-density material. There is moderate adjacent fat stranding. Small amount of free fluid in the pericolic gutter. There is adjacent thickening at the bases cecum. There are multiple prominent ileocolic lymph nodes, up to 9 mm short  axis dimension. Vascular/Lymphatic: No retroperitoneal adenopathy. Abdominal aorta is normal in caliber. Reproductive: Prostate gland normal in size. Bladder: Physiologically distended. Other: No free air or abscess. Postsurgical change in the anterior abdominal wall. Musculoskeletal: There are no acute or suspicious osseous abnormalities. Incidental note of 6 non-rib-bearing lumbar vertebra. IMPRESSION: Despite patient reported history appendectomy, the blind-ending tubular structure with intraluminal hyperdensity arising from the cecum is most consistent with acute appendicitis. There is a moderate amount of surrounding fat stranding and small volume of free fluid in the right lower quadrant. Multiple prominent lymph nodes in the ileocolic chain, likely reactive. These results were called by telephone at the time of interpretation on 12/13/2014 at 2:31 am to Dr. Tomasita Crumble , who verbally acknowledged these results. Electronically Signed   By: Rubye Oaks M.D.   On: 12/13/2014 02:33   I have personally reviewed and evaluated these images and lab results as part of my medical decision-making.  MDM   Final diagnoses:  None    Patient presents emergency department for abdominal pain and fever. He states he has a history of appendectomy however his CT scan shows an appendix with appendicitis. In  addition he has a fever to 39.2, tachycardia to 117, and white blood cell count of 17.3. Patient was given Zosyn for treatment, will paged general surgery for admission.   CRITICAL CARE Performed by: Tomasita CrumbleNI,Edword Cu   Total critical care time: 35 minutes - appendicitis with sepsis  Critical care time was exclusive of separately billable procedures and treating other patients.  Critical care was necessary to treat or prevent imminent or life-threatening deterioration.  Critical care was time spent personally by me on the following activities: development of treatment plan with patient and/or surrogate as  well as nursing, discussions with consultants, evaluation of patient's response to treatment, examination of patient, obtaining history from patient or surrogate, ordering and performing treatments and interventions, ordering and review of laboratory studies, ordering and review of radiographic studies, pulse oximetry and re-evaluation of patient's condition.   I personally performed the services described in this documentation, which was scribed in my presence. The recorded information has been reviewed and is accurate.     Tomasita CrumbleAdeleke Malania Gawthrop, MD 12/13/14 [phone]

## 2014-12-14 ENCOUNTER — Encounter (HOSPITAL_COMMUNITY): Payer: Self-pay | Admitting: General Surgery

## 2014-12-14 LAB — BASIC METABOLIC PANEL
Anion gap: 7 (ref 5–15)
BUN: 5 mg/dL — AB (ref 6–20)
CHLORIDE: 99 mmol/L — AB (ref 101–111)
CO2: 28 mmol/L (ref 22–32)
CREATININE: 0.8 mg/dL (ref 0.61–1.24)
Calcium: 8.8 mg/dL — ABNORMAL LOW (ref 8.9–10.3)
GFR calc Af Amer: >60 mL/min (ref >60)
GFR calc non Af Amer: >60 mL/min (ref >60)
GLUCOSE: 109 mg/dL — AB (ref 65–99)
POTASSIUM: 3.7 mmol/L (ref 3.5–5.1)
SODIUM: 134 mmol/L — AB (ref 135–145)

## 2014-12-14 LAB — CBC
HCT: 37.9 % — ABNORMAL LOW (ref 39.0–52.0)
HEMOGLOBIN: 13 g/dL (ref 13.0–17.0)
MCH: 27.8 pg (ref 26.0–34.0)
MCHC: 34.3 g/dL (ref 30.0–36.0)
MCV: 81.2 fL (ref 78.0–100.0)
Platelets: 161 10*3/uL (ref 150–400)
RBC: 4.67 MIL/uL (ref 4.22–5.81)
RDW: 13.2 % (ref 11.5–15.5)
WBC: 14.3 10*3/uL — ABNORMAL HIGH (ref 4.0–10.5)

## 2014-12-14 MED ORDER — WHITE PETROLATUM GEL
Status: AC
  Administered 2014-12-14: 1
  Filled 2014-12-14: qty 1

## 2014-12-14 MED ORDER — OXYCODONE-ACETAMINOPHEN 5-325 MG PO TABS
1.0000 | ORAL_TABLET | ORAL | Status: DC | PRN
  Administered 2014-12-15: 2 via ORAL
  Administered 2014-12-15: 1 via ORAL
  Administered 2014-12-16 – 2014-12-28 (×7): 2 via ORAL
  Filled 2014-12-14 (×6): qty 2
  Filled 2014-12-14: qty 1
  Filled 2014-12-14 (×2): qty 2

## 2014-12-14 NOTE — Progress Notes (Signed)
Patient ID: Dale Crane, male   DOB: 09/08/1985, 29 y.o.   MRN: [id number]     [facility]      [address] [place], [place] [address]    Phone: [phone] FAX: [phone]     Subjective: Has not been OOB. No n/v.  No flatus.  4L UOP. WBC down to 14.3k.   T max 103.3    Objective:  Vital signs:  Filed Vitals:   12/13/14 2118 12/14/14 0150 12/14/14 0200 12/14/14 0606  BP: 105/53 102/64  111/52  Pulse: 121 60  60  Temp: 99.9 F (37.7 C) 100.4 F (38 C) 99.6 F (37.6 C) 99.6 F (37.6 C)  TempSrc: Oral Oral Oral Oral  Resp: 19 22 20 21   Height:      Weight:      SpO2: 95%   96%       Intake/Output   Yesterday:  11/17 0701 - 11/18 0700 In: 5500.3 [I.V.:4900.3; IV Piggyback:600] Out: 9528 [Urine:4400; Drains:385; Blood:10] This shift: I/O last 3 completed shifts: In: 5500.3 [I.V.:4900.3; IV Piggyback:600] Out: 4132 [Urine:4400; Drains:385; Blood:10]    Physical Exam: General: Pt awake/alert/oriented x4 in no acute distress Chest: cta. No chest wall pain w good excursion CV:  Pulses intact.  Regular rhythm MS: Normal AROM mjr joints.  No obvious deformity Abdomen: hypoactive bowel sounds.  Distended.  ttp rlq.  rlq drain with serosanguinous output.  Ext:  SCDs BLE.  No mjr edema.  No cyanosis Skin: No petechiae / purpura   Problem List:   Active Problems:   Acute appendicitis   Perforated appendicitis    Results:   Labs: Results for orders placed or performed during the hospital encounter of 12/13/14 (from the past 48 hour(s))  Urinalysis, Routine w reflex microscopic (not at [facility])     Status: Abnormal   Collection Time: 12/13/14 12:06 AM  Result Value Ref Range   Color, Urine AMBER (A) YELLOW    Comment: BIOCHEMICALS MAY BE AFFECTED BY COLOR   APPearance CLEAR CLEAR   Specific Gravity, Urine 1.031 (H) 1.005 - 1.030   pH 5.5 5.0 - 8.0   Glucose, UA NEGATIVE NEGATIVE mg/dL   Hgb  urine dipstick TRACE (A) NEGATIVE   Bilirubin Urine NEGATIVE NEGATIVE   Ketones, ur NEGATIVE NEGATIVE mg/dL   Protein, ur NEGATIVE NEGATIVE mg/dL   Nitrite NEGATIVE NEGATIVE   Leukocytes, UA NEGATIVE NEGATIVE  Urine microscopic-add on     Status: Abnormal   Collection Time: 12/13/14 12:06 AM  Result Value Ref Range   Squamous Epithelial / LPF 0-5 (A) NONE SEEN    Comment: Please note change in reference range.   WBC, UA 0-5 0 - 5 WBC/hpf    Comment: Please note change in reference range.   RBC / HPF 0-5 0 - 5 RBC/hpf    Comment: Please note change in reference range.   Bacteria, UA RARE (A) NONE SEEN    Comment: Please note change in reference range.   Urine-Other MUCOUS PRESENT   Lipase, blood     Status: None   Collection Time: 12/13/14 12:24 AM  Result Value Ref Range   Lipase 25 11 - 51 U/L  Comprehensive metabolic panel     Status: Abnormal   Collection Time: 12/13/14 12:24 AM  Result Value Ref Range   Sodium 133 (L) 135 - 145 mmol/L   Potassium 4.1 3.5 - 5.1 mmol/L   Chloride 98 (L) 101 - 111  mmol/L   CO2 26 22 - 32 mmol/L   Glucose, Bld 135 (H) 65 - 99 mg/dL   BUN 8 6 - 20 mg/dL   Creatinine, Ser 0.77 0.61 - 1.24 mg/dL   Calcium 9.6 8.9 - 10.3 mg/dL   Total Protein 7.8 6.5 - 8.1 g/dL   Albumin 4.6 3.5 - 5.0 g/dL   AST 23 15 - 41 U/L   ALT 40 17 - 63 U/L   Alkaline Phosphatase 59 38 - 126 U/L   Total Bilirubin 0.9 0.3 - 1.2 mg/dL   GFR calc non Af Amer >60 >60 mL/min   GFR calc Af Amer >60 >60 mL/min    Comment: (NOTE) The eGFR has been calculated using the CKD EPI equation. This calculation has not been validated in all clinical situations. eGFR's persistently <60 mL/min signify possible Chronic Kidney Disease.    Anion gap 9 5 - 15  CBC     Status: Abnormal   Collection Time: 12/13/14 12:24 AM  Result Value Ref Range   WBC 17.3 (H) 4.0 - 10.5 K/uL   RBC 5.90 (H) 4.22 - 5.81 MIL/uL   Hemoglobin 16.3 13.0 - 17.0 g/dL   HCT 47.1 39.0 - 52.0 %   MCV 79.8  78.0 - 100.0 fL   MCH 27.6 26.0 - 34.0 pg   MCHC 34.6 30.0 - 36.0 g/dL   RDW 12.8 11.5 - 15.5 %   Platelets 209 150 - 400 K/uL  CBC     Status: Abnormal   Collection Time: 12/13/14  7:30 AM  Result Value Ref Range   WBC 17.1 (H) 4.0 - 10.5 K/uL   RBC 5.41 4.22 - 5.81 MIL/uL   Hemoglobin 14.7 13.0 - 17.0 g/dL   HCT 43.5 39.0 - 52.0 %   MCV 80.4 78.0 - 100.0 fL   MCH 27.2 26.0 - 34.0 pg   MCHC 33.8 30.0 - 36.0 g/dL   RDW 12.9 11.5 - 15.5 %   Platelets 185 150 - 400 K/uL  Surgical pcr screen     Status: None   Collection Time: 12/13/14 11:04 AM  Result Value Ref Range   MRSA, PCR NEGATIVE NEGATIVE   Staphylococcus aureus NEGATIVE NEGATIVE    Comment:        The Xpert SA Assay (FDA approved for NASAL specimens in patients over 60 years of age), is one component of a comprehensive surveillance program.  Test performance has been validated by [facility] for patients greater than or equal to 61 year old. It is not intended to diagnose infection nor to guide or monitor treatment.   CBC     Status: Abnormal   Collection Time: 12/14/14  6:14 AM  Result Value Ref Range   WBC 14.3 (H) 4.0 - 10.5 K/uL   RBC 4.67 4.22 - 5.81 MIL/uL   Hemoglobin 13.0 13.0 - 17.0 g/dL   HCT 37.9 (L) 39.0 - 52.0 %   MCV 81.2 78.0 - 100.0 fL   MCH 27.8 26.0 - 34.0 pg   MCHC 34.3 30.0 - 36.0 g/dL   RDW 13.2 11.5 - 15.5 %   Platelets 161 150 - 400 K/uL  Basic metabolic panel     Status: Abnormal   Collection Time: 12/14/14  6:14 AM  Result Value Ref Range   Sodium 134 (L) 135 - 145 mmol/L   Potassium 3.7 3.5 - 5.1 mmol/L   Chloride 99 (L) 101 - 111 mmol/L   CO2 28 22 - 32 mmol/L  Glucose, Bld 109 (H) 65 - 99 mg/dL   BUN 5 (L) 6 - 20 mg/dL   Creatinine, Ser 0.80 0.61 - 1.24 mg/dL   Calcium 8.8 (L) 8.9 - 10.3 mg/dL   GFR calc non Af Amer >60 >60 mL/min   GFR calc Af Amer >60 >60 mL/min    Comment: (NOTE) The eGFR has been calculated using the CKD EPI equation. This calculation has not been  validated in all clinical situations. eGFR's persistently <60 mL/min signify possible Chronic Kidney Disease.    Anion gap 7 5 - 15    Imaging / Studies: Ct Abdomen Pelvis W Contrast  12/13/2014  CLINICAL DATA:  Right lower quadrant pain since yesterday, fever. Patient reports history of prior appendectomy and surgery to deal with infection in [place] earlier this year. EXAM: CT ABDOMEN AND PELVIS WITH CONTRAST TECHNIQUE: Multidetector CT imaging of the abdomen and pelvis was performed using the standard protocol following bolus administration of intravenous contrast. CONTRAST:  153m OMNIPAQUE IOHEXOL 300 MG/ML  SOLN COMPARISON:  None. FINDINGS: Lower chest: Linear subsegmental atelectasis in the right lower lobe and lingula. Liver: Decreased density consistent with steatosis, no focal lesion. Hepatobiliary: Gallbladder physiologically distended, no calcified gallstone. No biliary dilatation. Pancreas: Normal. Spleen: Normal. Adrenal glands: No nodule. Kidneys: Symmetric renal enhancement.  No hydronephrosis. Stomach/Bowel: Stomach physiologically distended. There are no dilated or thickened small bowel loops. Small volume of stool throughout the colon. Within the right lower quadrant is a dilated blind-ending tubular structure arising from the cecum measuring up to 2.4 cm containing intraluminal high-density material. There is moderate adjacent fat stranding. Small amount of free fluid in the pericolic gutter. There is adjacent thickening at the bases cecum. There are multiple prominent ileocolic lymph nodes, up to 9 mm short axis dimension. Vascular/Lymphatic: No retroperitoneal adenopathy. Abdominal aorta is normal in caliber. Reproductive: Prostate gland normal in size. Bladder: Physiologically distended. Other: No free air or abscess. Postsurgical change in the anterior abdominal wall. Musculoskeletal: There are no acute or suspicious osseous abnormalities. Incidental note of 6 non-rib-bearing lumbar  vertebra. IMPRESSION: Despite patient reported history appendectomy, the blind-ending tubular structure with intraluminal hyperdensity arising from the cecum is most consistent with acute appendicitis. There is a moderate amount of surrounding fat stranding and small volume of free fluid in the right lower quadrant. Multiple prominent lymph nodes in the ileocolic chain, likely reactive. These results were called by telephone at the time of interpretation on 12/13/2014 at 2:31 am to Dr. AEverlene Balls, who verbally acknowledged these results. Electronically Signed   By: MJeb LeveringM.D.   On: 12/13/2014 02:33    Medications / Allergies:  Scheduled Meds: . enoxaparin (LOVENOX) injection  40 mg Subcutaneous Q24H  . piperacillin-tazobactam (ZOSYN)  IV  3.375 g Intravenous 3 times per day   Continuous Infusions: . 0.9 % NaCl with KCl 20 mEq / L 125 mL/hr at 12/14/14 0202  . lactated ringers     PRN Meds:.acetaminophen, diphenhydrAMINE **OR** diphenhydrAMINE, morphine injection, ondansetron **OR** ondansetron (ZOFRAN) IV  Antibiotics: Anti-infectives    Start     Dose/Rate Route Frequency Ordered Stop   12/13/14 1000  piperacillin-tazobactam (ZOSYN) IVPB 3.375 g     3.375 g 12.5 mL/hr over 240 Minutes Intravenous 3 times per day 12/13/14 0345     12/13/14 0600  piperacillin-tazobactam (ZOSYN) IVPB 3.375 g  Status:  Discontinued     3.375 g 12.5 mL/hr over 240 Minutes Intravenous 3 times per day 12/13/14 0332 12/13/14 0345  12/13/14 0245  piperacillin-tazobactam (ZOSYN) IVPB 3.375 g     3.375 g 100 mL/hr over 30 Minutes Intravenous  Once 12/13/14 0230 12/13/14 5992        Assessment/Plan Perforated appendicitis  Laparoscopic appendectomy and LOA---Dr. Grandville Silos  Post operative ileus -continues to have fevers, WBC trending down, drain with serosanguinous output -DC foley -drain care -mobilize, IS, pain control  ID- zosyn for perf appy FEN-continue with clears, IVF VTE  prophylaxis-SCD/lovenox Dispo-continue inpatient   Communicated via telephone interpreter.   Erby Pian, [facility] Surgery Pager [phone]) For consults and floor pages call [phone](7A-4:[phone])  12/14/2014 8:09 AM

## 2014-12-15 LAB — CBC
HEMATOCRIT: 39 % (ref 39.0–52.0)
HEMOGLOBIN: 13.5 g/dL (ref 13.0–17.0)
MCH: 28 pg (ref 26.0–34.0)
MCHC: 34.6 g/dL (ref 30.0–36.0)
MCV: 80.7 fL (ref 78.0–100.0)
Platelets: 161 10*3/uL (ref 150–400)
RBC: 4.83 MIL/uL (ref 4.22–5.81)
RDW: 12.9 % (ref 11.5–15.5)
WBC: 15.5 10*3/uL — AB (ref 4.0–10.5)

## 2014-12-15 NOTE — Progress Notes (Signed)
Patient ID: Dale DieselJonathan Abarca Vargas, male   DOB: November 14, 1985, 29 y.o.   MRN: [id number] 2 Days Post-Op  Subjective: Interpreter was used for this visit. He states his pain is better but still some intermittent right lower quadrant pain with movement or bowel movements. Has had some loose bowel movements. Denies nausea. Overall states he feels better. Tolerating clear liquids.  Objective: Vital signs in last 24 hours: Temp:  [98.6 F (37 C)-99.6 F (37.6 C)] 99.6 F (37.6 C) (11/19 0528) Pulse Rate:  [109-118] 109 (11/19 0528) Resp:  [19-20] 19 (11/19 0528) BP: (123-128)/(63-74) 123/74 mmHg (11/19 0528) SpO2:  [96 %-100 %] 100 % (11/19 0528)    Intake/Output from previous day: 11/18 0701 - 11/19 0700 In: 3814.7 [P.O.:1450; I.V.:2364.7] Out: 1900 [Urine:1875; Drains:25] Intake/Output this shift:    General appearance: alert, cooperative and no distress GI: mmild distention. Soft with mild right lower quadrant tenderness. Incision/Wound: laparoscopic incisions clean and dry. JP drainage is serosanguineous.  Lab Results:   Recent Labs  12/14/14 0614 12/15/14 0355  WBC 14.3* 15.5*  HGB 13.0 13.5  HCT 37.9* 39.0  PLT 161 161   BMET  Recent Labs  12/13/14 0024 12/14/14 0614  NA 133* 134*  K 4.1 3.7  CL 98* 99*  CO2 26 28  GLUCOSE 135* 109*  BUN 8 5*  CREATININE 0.77 0.80  CALCIUM 9.6 8.8*     Studies/Results: No results found.  Anti-infectives: Anti-infectives    Start     Dose/Rate Route Frequency Ordered Stop   12/13/14 1000  piperacillin-tazobactam (ZOSYN) IVPB 3.375 g     3.375 g 12.5 mL/hr over 240 Minutes Intravenous 3 times per day 12/13/14 0345     12/13/14 0600  piperacillin-tazobactam (ZOSYN) IVPB 3.375 g  Status:  Discontinued     3.375 g 12.5 mL/hr over 240 Minutes Intravenous 3 times per day 12/13/14 0332 12/13/14 0345   12/13/14 0245  piperacillin-tazobactam (ZOSYN) IVPB 3.375 g     3.375 g 100 mL/hr over 30 Minutes Intravenous  Once 12/13/14  0230 12/13/14 0306      Assessment/Plan: s/p Procedure(s): APPENDECTOMY LAPAROSCOPIC FOR RUPTURED APPENDIX; LAPAROSCOPIC LYSIS OF ADHESIONS 45 minutes Fever curve is better but white count remains mildly elevated. Medically stable. Continue IV antibiotics. Advance to full liquid diet. Ambulation encouraged.   LOS: 2 days    Novice Vrba T 12/15/2014

## 2014-12-16 NOTE — Progress Notes (Signed)
Patient ID: Dale Crane, male   DOB: Jun 14, 1985, 29 y.o.   MRN: [id number] 3 Days Post-Op  Subjective: Reports less pain. Tolerating full liquids without nausea. Has had bowel movements.  Objective: Vital signs in last 24 hours: Temp:  [98 F (36.7 C)-98.6 F (37 C)] 98 F (36.7 C) (11/20 0700) Pulse Rate:  [89-98] 89 (11/20 0700) Resp:  [18-20] 18 (11/20 0700) BP: (124-141)/(61-87) 124/61 mmHg (11/20 0700) SpO2:  [97 %-98 %] 98 % (11/20 0700) Last BM Date: 12/15/14  Intake/Output from previous day: 11/19 0701 - 11/20 0700 In: 3645 [P.O.:920; I.V.:2625; IV Piggyback:100] Out: 170 [Urine:150; Drains:20] Intake/Output this shift:    General appearance: alert, cooperative and no distress GI: moderate localized tenderness in the right lower quadrant unchanged. Incision/Wound: clean and dry. JP drainage is serosanguineous.  Lab Results:   Recent Labs  12/14/14 0614 12/15/14 0355  WBC 14.3* 15.5*  HGB 13.0 13.5  HCT 37.9* 39.0  PLT 161 161   BMET  Recent Labs  12/14/14 0614  NA 134*  K 3.7  CL 99*  CO2 28  GLUCOSE 109*  BUN 5*  CREATININE 0.80  CALCIUM 8.8*     Studies/Results: No results found.  Anti-infectives: Anti-infectives    Start     Dose/Rate Route Frequency Ordered Stop   12/13/14 1000  piperacillin-tazobactam (ZOSYN) IVPB 3.375 g     3.375 g 12.5 mL/hr over 240 Minutes Intravenous 3 times per day 12/13/14 0345     12/13/14 0600  piperacillin-tazobactam (ZOSYN) IVPB 3.375 g  Status:  Discontinued     3.375 g 12.5 mL/hr over 240 Minutes Intravenous 3 times per day 12/13/14 0332 12/13/14 0345   12/13/14 0245  piperacillin-tazobactam (ZOSYN) IVPB 3.375 g     3.375 g 100 mL/hr over 30 Minutes Intravenous  Once 12/13/14 0230 12/13/14 0306      Assessment/Plan: s/p Procedure(s): APPENDECTOMY LAPAROSCOPIC FOR RUPTURED APPENDIX; LAPAROSCOPIC LYSIS OF ADHESIONS 45 minutes Subjectively improved and does not appear ill but still with  significant tenderness in right lower quadrant and white count somewhat elevated. Continue IV antibiotics and observation today.    LOS: 3 days    Johann Gascoigne T 12/16/2014

## 2014-12-17 LAB — CBC
HEMATOCRIT: 36.9 % — AB (ref 39.0–52.0)
HEMOGLOBIN: 12.7 g/dL — AB (ref 13.0–17.0)
MCH: 27.3 pg (ref 26.0–34.0)
MCHC: 34.4 g/dL (ref 30.0–36.0)
MCV: 79.4 fL (ref 78.0–100.0)
Platelets: 249 10*3/uL (ref 150–400)
RBC: 4.65 MIL/uL (ref 4.22–5.81)
RDW: 12.8 % (ref 11.5–15.5)
WBC: 13.2 10*3/uL — ABNORMAL HIGH (ref 4.0–10.5)

## 2014-12-17 NOTE — Progress Notes (Signed)
Utilization review complete. Winston Misner RN CCM Case Mgmt phone [phone] 

## 2014-12-17 NOTE — Progress Notes (Signed)
4 Days Post-Op  Subjective: Still tender RLQ, drain is clear, he is tolerating full liquids and having BM's.  Objective: Vital signs in last 24 hours: Temp:  [99.5 F (37.5 C)-100 F (37.8 C)] 99.6 F (37.6 C) (11/21 0621) Pulse Rate:  [96-106] 96 (11/21 0621) Resp:  [19-22] 20 (11/21 0621) BP: (122-128)/(70-77) 128/76 mmHg (11/21 0621) SpO2:  [96 %-99 %] 96 % (11/21 0621) Last BM Date: 12/16/14 PO 920   Diet: full liquids    Drain 20 Stool x 2  TM 100 at 1900 hours last PM WBC still up 13.2   CT was 12/13/14   Intake/Output from previous day: 11/20 0701 - 11/21 0700 In: 3878.3 [P.O.:920; I.V.:2908.3; IV Piggyback:50] Out: 870 [Urine:850; Drains:20] Intake/Output this shift:    General appearance: alert, cooperative and no distress Resp: clear to auscultation bilaterally GI: soft, sore/tender RLQ, port sites and old incisions look fine.  Drainage is clear and serous, + BS.  Lab Results:   Recent Labs  12/15/14 0355 12/17/14 0428  WBC 15.5* 13.2*  HGB 13.5 12.7*  HCT 39.0 36.9*  PLT 161 249    BMET No results for input(s): NA, K, CL, CO2, GLUCOSE, BUN, CREATININE, CALCIUM in the last 72 hours. PT/INR No results for input(s): LABPROT, INR in the last 72 hours.   Recent Labs Lab 12/13/14 0024  AST 23  ALT 40  ALKPHOS 59  BILITOT 0.9  PROT 7.8  ALBUMIN 4.6     Lipase     Component Value Date/Time   LIPASE 25 12/13/2014 0024     Studies/Results: No results found.  Medications: . enoxaparin (LOVENOX) injection  40 mg Subcutaneous Q24H  . piperacillin-tazobactam (ZOSYN)  IV  3.375 g Intravenous 3 times per day   Scheduled Meds: . enoxaparin (LOVENOX) injection  40 mg Subcutaneous Q24H  . piperacillin-tazobactam (ZOSYN)  IV  3.375 g Intravenous 3 times per day   Continuous Infusions: . 0.9 % NaCl with KCl 20 mEq / L 125 mL/hr at 12/17/14 0550  . lactated ringers     PRN Meds:.acetaminophen, diphenhydrAMINE **OR** diphenhydrAMINE, morphine  injection, ondansetron **OR** ondansetron (ZOFRAN) IV, oxyCODONE-acetaminophen   Prior to Admission medications   Not on File    Assessment/Plan Perforated appendicitis S/p APPENDECTOMY LAPAROSCOPIC FOR RUPTURED APPENDIX, LAPAROSCOPIC LYSIS OF ADHESIONS 45 MINUTES; 12/13/14, Dr. Violeta GelinasBurke Thompson  POD4 2 prior surgeries in [place] for appendicitis Post op ileus  Antibiotics:  Day 6 Zosyn DVT:  Lovenox/SCD    Plan:  Continue antibiotics, soft diet, ambulate, and recheck labs in AM.  If fevers, leukocytosis and tenderness continue we  May need to repeat CT in the next 24-48 hours.      LOS: 4 days    Ashauna Bertholf 12/17/2014

## 2014-12-18 DIAGNOSIS — K567 Ileus, unspecified: Secondary | ICD-10-CM | POA: Diagnosis not present

## 2014-12-18 DIAGNOSIS — K9189 Other postprocedural complications and disorders of digestive system: Secondary | ICD-10-CM

## 2014-12-18 LAB — CBC
HEMATOCRIT: 38.4 % — AB (ref 39.0–52.0)
HEMOGLOBIN: 13.5 g/dL (ref 13.0–17.0)
MCH: 28 pg (ref 26.0–34.0)
MCHC: 35.2 g/dL (ref 30.0–36.0)
MCV: 79.5 fL (ref 78.0–100.0)
Platelets: 285 10*3/uL (ref 150–400)
RBC: 4.83 MIL/uL (ref 4.22–5.81)
RDW: 12.9 % (ref 11.5–15.5)
WBC: 14.7 10*3/uL — ABNORMAL HIGH (ref 4.0–10.5)

## 2014-12-18 LAB — BASIC METABOLIC PANEL
Anion gap: 10 (ref 5–15)
BUN: 9 mg/dL (ref 6–20)
CHLORIDE: 102 mmol/L (ref 101–111)
CO2: 22 mmol/L (ref 22–32)
CREATININE: 0.73 mg/dL (ref 0.61–1.24)
Calcium: 8.9 mg/dL (ref 8.9–10.3)
GFR calc non Af Amer: >60 mL/min (ref >60)
Glucose, Bld: 117 mg/dL — ABNORMAL HIGH (ref 65–99)
Potassium: 3.8 mmol/L (ref 3.5–5.1)
Sodium: 134 mmol/L — ABNORMAL LOW (ref 135–145)

## 2014-12-18 MED ORDER — AMOXICILLIN-POT CLAVULANATE 875-125 MG PO TABS
1.0000 | ORAL_TABLET | Freq: Two times a day (BID) | ORAL | Status: DC
  Administered 2014-12-18 – 2014-12-19 (×3): 1 via ORAL
  Filled 2014-12-18 (×3): qty 1

## 2014-12-18 NOTE — Progress Notes (Signed)
5 Days Post-Op  Subjective: He is still tender over the drain,but is otherwise doing pretty good.  Tolerating diet and walking.    Objective: Vital signs in last 24 hours: Temp:  [98.8 F (37.1 C)-99.7 F (37.6 C)] 98.8 F (37.1 C) (11/22 0450) Pulse Rate:  [93-101] 93 (11/22 0450) Resp:  [18-20] 18 (11/22 0450) BP: (111-119)/(70-73) 119/70 mmHg (11/22 0450) SpO2:  [97 %-99 %] 97 % (11/22 0450) Last BM Date: 12/16/14 800 Po Drain 10 ml Afebrile, VSS WBC up some 14.7  Intake/Output from previous day: 11/21 0701 - 11/22 0700 In: 800 [P.O.:800] Out: 10 [Drains:10] Intake/Output this shift:    General appearance: alert, cooperative and no distress Resp: clear to auscultation bilaterally GI: still a little tender, but mostly over the drain.  Drain is clear. + BS  Lab Results:   Recent Labs  12/17/14 0428 12/18/14 0308  WBC 13.2* 14.7*  HGB 12.7* 13.5  HCT 36.9* 38.4*  PLT 249 285    BMET  Recent Labs  12/18/14 0308  NA 134*  K 3.8  CL 102  CO2 22  GLUCOSE 117*  BUN 9  CREATININE 0.73  CALCIUM 8.9   PT/INR No results for input(s): LABPROT, INR in the last 72 hours.   Recent Labs Lab 12/13/14 0024  AST 23  ALT 40  ALKPHOS 59  BILITOT 0.9  PROT 7.8  ALBUMIN 4.6     Lipase     Component Value Date/Time   LIPASE 25 12/13/2014 0024     Studies/Results: No results found.  Medications: . enoxaparin (LOVENOX) injection  40 mg Subcutaneous Q24H  . piperacillin-tazobactam (ZOSYN)  IV  3.375 g Intravenous 3 times per day    Assessment/Plan Perforated appendicitis S/p APPENDECTOMY LAPAROSCOPIC FOR RUPTURED APPENDIX, LAPAROSCOPIC LYSIS OF ADHESIONS 45 MINUTES; 12/13/14, Dr. Violeta GelinasBurke Thompson POD4 2 prior surgeries in [place] appendicitis Post op ileus  Antibiotics: Day 6 Zosyn completed, switch to PO Augmentin  DVT: Lovenox/SCD  Plan:  Stop Zosyn and switch to Augmentin.  Recheck labs and decide on CT or discharge tomorrow.      LOS: 5  days    Bradly Sangiovanni 12/18/2014

## 2014-12-18 NOTE — Progress Notes (Signed)
Pt a/o spanish speaking, pt c/o pain PRN Percocet given as ordered, VSS, pt stable

## 2014-12-19 ENCOUNTER — Inpatient Hospital Stay (HOSPITAL_COMMUNITY): Payer: Self-pay

## 2014-12-19 ENCOUNTER — Encounter (HOSPITAL_COMMUNITY): Payer: Self-pay | Admitting: *Deleted

## 2014-12-19 DIAGNOSIS — R188 Other ascites: Secondary | ICD-10-CM | POA: Insufficient documentation

## 2014-12-19 LAB — CBC
HCT: 38 % — ABNORMAL LOW (ref 39.0–52.0)
HEMOGLOBIN: 12.9 g/dL — AB (ref 13.0–17.0)
MCH: 27.4 pg (ref 26.0–34.0)
MCHC: 33.9 g/dL (ref 30.0–36.0)
MCV: 80.9 fL (ref 78.0–100.0)
Platelets: 314 10*3/uL (ref 150–400)
RBC: 4.7 MIL/uL (ref 4.22–5.81)
RDW: 12.9 % (ref 11.5–15.5)
WBC: 15.6 10*3/uL — AB (ref 4.0–10.5)

## 2014-12-19 MED ORDER — ENOXAPARIN SODIUM 40 MG/0.4ML ~~LOC~~ SOLN
40.0000 mg | SUBCUTANEOUS | Status: DC
  Administered 2014-12-21 – 2014-12-25 (×5): 40 mg via SUBCUTANEOUS
  Filled 2014-12-19 (×5): qty 0.4

## 2014-12-19 MED ORDER — IOHEXOL 300 MG/ML  SOLN
25.0000 mL | INTRAMUSCULAR | Status: AC
  Administered 2014-12-19 (×2): 25 mL via ORAL

## 2014-12-19 MED ORDER — PIPERACILLIN-TAZOBACTAM 3.375 G IVPB
3.3750 g | Freq: Three times a day (TID) | INTRAVENOUS | Status: DC
Start: 2014-12-19 — End: 2014-12-21
  Administered 2014-12-19 – 2014-12-21 (×6): 3.375 g via INTRAVENOUS
  Filled 2014-12-19 (×8): qty 50

## 2014-12-19 MED ORDER — IOHEXOL 300 MG/ML  SOLN
100.0000 mL | Freq: Once | INTRAMUSCULAR | Status: AC | PRN
  Administered 2014-12-19: 100 mL via INTRAVENOUS

## 2014-12-19 NOTE — Progress Notes (Signed)
ANTIBIOTIC CONSULT NOTE - INITIAL  Pharmacy Consult for Zosyn Indication: intra-abdominal infection  No Known Allergies  Patient Measurements: Height: 5\' 8"  (172.7 cm) Weight: 215 lb 9.8 oz (97.8 kg) IBW/kg (Calculated) : 68.4  Vital Signs: Temp: 98 F (36.7 C) (11/23 0546) BP: 106/63 mmHg (11/23 0546) Pulse Rate: 66 (11/23 0546) Intake/Output from previous day: 11/22 0701 - 11/23 0700 In: 480 [P.O.:480] Out: 5 [Drains:5] Intake/Output from this shift:    Labs:  Recent Labs  12/17/14 0428 12/18/14 0308 12/19/14 0251  WBC 13.2* 14.7* 15.6*  HGB 12.7* 13.5 12.9*  PLT 249 285 314  CREATININE  --  0.73  --    Estimated Creatinine Clearance: 154.6 mL/min (by C-G formula based on Cr of 0.73). No results for input(s): VANCOTROUGH, VANCOPEAK, VANCORANDOM, GENTTROUGH, GENTPEAK, GENTRANDOM, TOBRATROUGH, TOBRAPEAK, TOBRARND, AMIKACINPEAK, AMIKACINTROU, AMIKACIN in the last 72 hours.   Microbiology: Recent Results (from the past 720 hour(s))  Surgical pcr screen     Status: None   Collection Time: 12/13/14 11:04 AM  Result Value Ref Range Status   MRSA, PCR NEGATIVE NEGATIVE Final   Staphylococcus aureus NEGATIVE NEGATIVE Final    Comment:        The Xpert SA Assay (FDA approved for NASAL specimens in patients over 29 years of age), is one component of a comprehensive surveillance program.  Test performance has been validated by [facility] for patients greater than or equal to 29 year old. It is not intended to diagnose infection nor to guide or monitor treatment.     Assessment: Dale Crane s/p appendectomy for ruptured appendix and lysis of adhesions. Had 2 prior surgeries in [place] appendicitis. Had previously completed 6 days of Zosyn for intra-abdominal infection and was transitioned to PO Augmentin (D#2). Now abscess noted on CT and to transition back to Zosyn. Today is D#7 of antibiotics overall.  WBC 15.6, afeb. SCr 0.73, CrCl >16700mL/min  Goal of  Therapy:  eradication of infection  Plan:  -Zosyn 3.375g IV q8h EI -follow imaging, renal function, clinical progression, total LOT for antibiotics  Cinnamon Morency D. Mahari Strahm, PharmD, BCPS Clinical Pharmacist Pager: [phone] 12/19/2014 1:40 PM

## 2014-12-19 NOTE — Progress Notes (Signed)
Interpreter Wyvonnia DuskyGraciela Namihira for Delene LollPam R PA

## 2014-12-19 NOTE — Progress Notes (Signed)
6 Days Post-Op  Subjective: He looks great, only pain is in the abdomen around the drain.  Eating hardy breakfast.  Minimal discomfort walking.  Objective: Vital signs in last 24 hours: Temp:  [97.9 F (36.6 C)-98.4 F (36.9 C)] 98 F (36.7 C) (11/23 0546) Pulse Rate:  [66-92] 66 (11/23 0546) Resp:  [18] 18 (11/23 0546) BP: (103-106)/(63-69) 106/63 mmHg (11/23 0546) SpO2:  [98 %-99 %] 99 % (11/23 0546) Last BM Date: 12/16/14 480 PO Afebrile, VSS WBC up to 15.6 Intake/Output from previous day: 11/22 0701 - 11/23 0700 In: 480 [P.O.:480] Out: 5 [Drains:5] Intake/Output this shift:    General appearance: alert, cooperative and no distress Resp: clear to auscultation bilaterally GI: sore around the drain site.  the drainage is clear serous fluid.  Lab Results:   Recent Labs  12/18/14 0308 12/19/14 0251  WBC 14.7* 15.6*  HGB 13.5 12.9*  HCT 38.4* 38.0*  PLT 285 314    BMET  Recent Labs  12/18/14 0308  NA 134*  K 3.8  CL 102  CO2 22  GLUCOSE 117*  BUN 9  CREATININE 0.73  CALCIUM 8.9   PT/INR No results for input(s): LABPROT, INR in the last 72 hours.   Recent Labs Lab 12/13/14 0024  AST 23  ALT 40  ALKPHOS 59  BILITOT 0.9  PROT 7.8  ALBUMIN 4.6     Lipase     Component Value Date/Time   LIPASE 25 12/13/2014 0024     Studies/Results: No results found.  Medications: . amoxicillin-clavulanate  1 tablet Oral Q12H  . enoxaparin (LOVENOX) injection  40 mg Subcutaneous Q24H    Assessment/Plan Perforated appendicitis S/p APPENDECTOMY LAPAROSCOPIC FOR RUPTURED APPENDIX, LAPAROSCOPIC LYSIS OF ADHESIONS 45 MINUTES; 12/13/14, Dr. Violeta GelinasBurke Thompson POD4 2 prior surgeries in [place] for appendicitis Post op ileus  Antibiotics: Day 6 Zosyn completed, day 2 Augmentin  DVT: Lovenox/SCD    Plan:  I will repeat the CT and be sure we do not have an abscess developing.  If ok we can send home later today.     LOS: 6 days     Dale Crane 12/19/2014

## 2014-12-19 NOTE — Care Management Note (Signed)
Case Management Note  Patient Details  Name: Abelardo DieselJonathan Abarca Vargas MRN: [id number] Date of Birth: 05/19/1985  Subjective/Objective:                    Action/Plan:  MATCH letter provided and explained by interpreter. Dated 12-19-14 to 12-26-14 , 14 day exception entered for percocet .  PEPEinterpreterRCOCETRCOCET Expected Discharge Date:                  Expected Discharge Plan:  Home/Self Care  In-House Referral:     Discharge planning Services  CM Consult, [facility], Medication Assistance  Post Acute Care Choice:    Choice offered to:     DME Arranged:    DME Agency:     HH Arranged:    HH Agency:     Status of Service:  Completed, signed off  Medicare Important Message Given:    Date Medicare IM Given:    Medicare IM give by:    Date Additional Medicare IM Given:    Additional Medicare Important Message give by:     If discussed at Long Length of Stay Meetings, dates discussed:    Additional Comments:  Kingsley PlanWile, Taylon Louison Marie, RN 12/19/2014, 1:08 PM

## 2014-12-19 NOTE — Progress Notes (Signed)
Interpreter [name] NamWyvonnia Duskyihira for Care management Herbert SetaHeather

## 2014-12-19 NOTE — Consult Note (Signed)
Chief Complaint: Patient was seen in consultation today for abdominal abscess aspiration Chief Complaint  Patient presents with  . Abdominal Pain   at the request of Dr Luisa Hart  Referring Physician(s): Dr Luisa Hart  History of Present Illness: Dale Crane is a 29 y.o. male   Pt has hx of abdominal surgeries in [place] for "appendicitis twice" Jan and March 2013 Lap appy 12/13/14 at [facility] Was really to be sent home today but noticed rise in wbc CT scan 11/23:  IMPRESSION: 1. There is a collection adjacent to inferior aspect of the liver with air-fluid level and high-density material centrally probable migrated appendicolith. This is highly suspicious of subcapsular abscess measures 4.6 by 3.1 cm. 2. Persistent inflammatory changes in right lower quadrant post ruptured appendicitis. A percutaneous drain is noted in right lower quadrant. Small residual abscess in right lower quadrant measures 2.5 x 2.8 cm. Reactive mesenteric lymph nodes are noted in right lower quadrant. 3. There is a pelvic collection just posterior to urinary bladder with enhancing wall measures 4.3 x 3 cm suspicious for pelvic abscess. 4. No small bowel obstruction. 5. No hydronephrosis or hydroureter.  Request has been made for percutaneous drain placement Dr Bonnielee Haff has reviewed imaging Small abscesses too small for drain One collection in RLQ is large enough for aspiration only Now scheduled for same 11/23 Pt has eaten today and did get Lovenox injection this am  History reviewed. No pertinent past medical history.  Past Surgical History  Procedure Laterality Date  . Appendectomy    . Laparoscopic appendectomy N/A 12/13/2014    Procedure: APPENDECTOMY LAPAROSCOPIC FOR RUPTURED APPENDIX; LAPAROSCOPIC LYSIS OF ADHESIONS 45 minutes;  Surgeon: Violeta Gelinas, MD;  Location: [facility] OR;  Service: General;  Laterality: N/A;    Allergies: Review of patient's allergies indicates no known  allergies.  Medications: Prior to Admission medications   Not on File     History reviewed. No pertinent family history.  Social History   Social History  . Marital Status: N/A    Spouse Name: N/A  . Number of Children: N/A  . Years of Education: N/A   Social History Main Topics  . Smoking status: Never Smoker   . Smokeless tobacco: None  . Alcohol Use: Yes  . Drug Use: No  . Sexual Activity: Not Asked   Other Topics Concern  . None   Social History Narrative     Review of Systems: A 12 point ROS discussed and pertinent positives are indicated in the HPI above.  All other systems are negative.  Review of Systems  Constitutional: Positive for activity change, appetite change and fatigue. Negative for fever.  Respiratory: Negative for cough and shortness of breath.   Gastrointestinal: Positive for abdominal pain. Negative for nausea and vomiting.  Psychiatric/Behavioral: Negative for behavioral problems and confusion.    Vital Signs: BP 106/63 mmHg  Pulse 66  Temp(Src) 98 F (36.7 C) (Oral)  Resp 18  Ht  (1.727 m)  Wt 215 lb 9.8 oz (97.8 kg)  BMI 32.79 kg/m2  SpO2 99%  Physical Exam  Constitutional: He is oriented to person, place, and time.  Speaks only Spanish Spoke to pt through Interpreter--[name]  Cardiovascular: Normal rate, regular rhythm and normal heart sounds.   Pulmonary/Chest: Effort normal.  Abdominal: Soft. Bowel sounds are normal. There is tenderness.  Musculoskeletal: Normal range of motion.  Neurological: He is alert and oriented to person, place, and time.  Skin: Skin is warm and dry.  Psychiatric: He has a normal mood and affect. His behavior is normal. Judgment and thought content normal.  Nursing note and vitals reviewed.   Mallampati Score:  MD Evaluation Airway: WNL Heart: WNL Abdomen: WNL Chest/ Lungs: WNL ASA  Classification: 2 Mallampati/Airway Score: One  Imaging: Ct Abdomen Pelvis W Contrast  12/19/2014   CLINICAL DATA:  Persistent mid abdominal pain, status post ruptured appendicitis EXAM: CT ABDOMEN AND PELVIS WITH CONTRAST TECHNIQUE: Multidetector CT imaging of the abdomen and pelvis was performed using the standard protocol following bolus administration of intravenous contrast. CONTRAST:  OMNIPAQUE IOHEXOL 300 MG/ML  SOLN COMPARISON:  12/13/2014 FINDINGS: There is atelectasis or infiltrate in right lower lobe posteriorly with some air bronchogram. Contracted gallbladder is noted. Small amount of perihepatic inferior ascites. There is a collection adjacent to inferior aspect of the liver with air-fluid level and high-density material centrally probable migrated appendicolith. This is highly suspicious of subcapsular abscess measures 4.6 by 3.1 cm. There is small amount of fluid in right paracolic gutter. Persistent mild inflammatory changes in right lower quadrant post rupture appendicitis. A percutaneous drain is noted in right lower quadrant. Small residual abscess in right lower quadrant measures 2.5 by 2.8 cm. Small probable reactive lymph nodes are noted in right lower quadrant mesentery. There is no evidence of small bowel obstruction. Kidneys are symmetrical in size and enhancement. No hydronephrosis or hydroureter. Small amount of air probable post instrumentation noted within urinary bladder. There is a small collection within pelvis just posterior with urinary bladder with enhancing wall measures 4.3 by 3 cm suspicious for abscess. Axial image 43 there is small amount of soft tissue air within right flank wall. This is probable post surgical in nature. Please see also axial image 53. IMPRESSION: 1. There is a collection adjacent to inferior aspect of the liver with air-fluid level and high-density material centrally probable migrated appendicolith. This is highly suspicious of subcapsular abscess measures 4.6 by 3.1 cm. 2. Persistent inflammatory changes in right lower quadrant post ruptured  appendicitis. A percutaneous drain is noted in right lower quadrant. Small residual abscess in right lower quadrant measures 2.5 x 2.8 cm. Reactive mesenteric lymph nodes are noted in right lower quadrant. 3. There is a pelvic collection just posterior to urinary bladder with enhancing wall measures 4.3 x 3 cm suspicious for pelvic abscess. 4. No small bowel obstruction. 5. No hydronephrosis or hydroureter. Electronically Signed   By: Natasha Mead M.D.   On: 12/19/2014 12:21   Ct Abdomen Pelvis W Contrast  12/13/2014  CLINICAL DATA:  Right lower quadrant pain since yesterday, fever. Patient reports history of prior appendectomy and surgery to deal with infection in [place] earlier this year. EXAM: CT ABDOMEN AND PELVIS WITH CONTRAST TECHNIQUE: Multidetector CT imaging of the abdomen and pelvis was performed using the standard protocol following bolus administration of intravenous contrast. CONTRAST:  OMNIPAQUE IOHEXOL 300 MG/ML  SOLN COMPARISON:  None. FINDINGS: Lower chest: Linear subsegmental atelectasis in the right lower lobe and lingula. Liver: Decreased density consistent with steatosis, no focal lesion. Hepatobiliary: Gallbladder physiologically distended, no calcified gallstone. No biliary dilatation. Pancreas: Normal. Spleen: Normal. Adrenal glands: No nodule. Kidneys: Symmetric renal enhancement.  No hydronephrosis. Stomach/Bowel: Stomach physiologically distended. There are no dilated or thickened small bowel loops. Small volume of stool throughout the colon. Within the right lower quadrant is a dilated blind-ending tubular structure arising from the cecum measuring up to 2.4 cm containing intraluminal high-density material. There is moderate adjacent fat stranding.  Small amount of free fluid in the pericolic gutter. There is adjacent thickening at the bases cecum. There are multiple prominent ileocolic lymph nodes, up to 9 mm short axis dimension. Vascular/Lymphatic: No retroperitoneal adenopathy.  Abdominal aorta is normal in caliber. Reproductive: Prostate gland normal in size. Bladder: Physiologically distended. Other: No free air or abscess. Postsurgical change in the anterior abdominal wall. Musculoskeletal: There are no acute or suspicious osseous abnormalities. Incidental note of 6 non-rib-bearing lumbar vertebra. IMPRESSION: Despite patient reported history appendectomy, the blind-ending tubular structure with intraluminal hyperdensity arising from the cecum is most consistent with acute appendicitis. There is a moderate amount of surrounding fat stranding and small volume of free fluid in the right lower quadrant. Multiple prominent lymph nodes in the ileocolic chain, likely reactive. These results were called by telephone at the time of interpretation on 12/13/2014 at 2:31 am to Dr. Tomasita CrumbleADELEKE ONI , who verbally acknowledged these results. Electronically Signed   By: Rubye OaksMelanie  Ehinger M.D.   On: 12/13/2014 02:33    Labs:  CBC:  Recent Labs  12/15/14 0355 12/17/14 0428 12/18/14 0308 12/19/14 0251  WBC 15.5* 13.2* 14.7* 15.6*  HGB 13.5 12.7* 13.5 12.9*  HCT 39.0 36.9* 38.4* 38.0*  PLT 161 249 285 314    COAGS: No results for input(s): INR, APTT in the last 8760 hours.  BMP:  Recent Labs  12/13/14 0024 12/14/14 0614 12/18/14 0308  NA 133* 134* 134*  K 4.1 3.7 3.8  CL 98* 99* 102  CO2 26 28 22   GLUCOSE 135* 109* 117*  BUN 8 5* 9  CALCIUM 9.6 8.8* 8.9  CREATININE 0.77 0.80 0.73  GFRNONAA >60 >60 >60  GFRAA >60 >60 >60    LIVER FUNCTION TESTS:  Recent Labs  12/13/14 0024  BILITOT 0.9  AST 23  ALT 40  ALKPHOS 59  PROT 7.8  ALBUMIN 4.6    TUMOR MARKERS: No results for input(s): AFPTM, CEA, CA199, CHROMGRNA in the last 8760 hours.  Assessment and Plan:  Lap appendectomy 11/17 Intraabdominal abscess noted Scheduled for aspiration only per Dr Hoss---11/24 in Radiology Risks and Benefits discussed with the patient including bleeding, infection, damage to  adjacent structures, bowel perforation/fistula connection, and sepsis. All of the patient's questions were answered, patient is agreeable to proceed. Consent signed and in chart. Spoke with pt through Interpreter at bedside--[name]  Thank you for this interesting consult.  I greatly enjoyed meeting Dale DieselJonathan Abarca Crane and look forward to participating in their care.  A copy of this report was sent to the requesting provider on this date.  Signed: Demarrio Menges A 12/19/2014, 2:57 PM   I spent a total of 40 Minutes    in face to face in clinical consultation, greater than 50% of which was counseling/coordinating care for abd abscess aspiration

## 2014-12-20 ENCOUNTER — Inpatient Hospital Stay (HOSPITAL_COMMUNITY): Payer: Self-pay

## 2014-12-20 LAB — BASIC METABOLIC PANEL
ANION GAP: 9 (ref 5–15)
BUN: 7 mg/dL (ref 6–20)
CALCIUM: 8.9 mg/dL (ref 8.9–10.3)
CO2: 27 mmol/L (ref 22–32)
CREATININE: 0.74 mg/dL (ref 0.61–1.24)
Chloride: 98 mmol/L — ABNORMAL LOW (ref 101–111)
GFR calc Af Amer: >60 mL/min (ref >60)
Glucose, Bld: 99 mg/dL (ref 65–99)
Potassium: 4.1 mmol/L (ref 3.5–5.1)
Sodium: 134 mmol/L — ABNORMAL LOW (ref 135–145)

## 2014-12-20 LAB — CBC
HCT: 39.9 % (ref 39.0–52.0)
HEMOGLOBIN: 13.6 g/dL (ref 13.0–17.0)
MCH: 27.3 pg (ref 26.0–34.0)
MCHC: 34.1 g/dL (ref 30.0–36.0)
MCV: 80.1 fL (ref 78.0–100.0)
PLATELETS: 371 10*3/uL (ref 150–400)
RBC: 4.98 MIL/uL (ref 4.22–5.81)
RDW: 12.7 % (ref 11.5–15.5)
WBC: 13.6 10*3/uL — AB (ref 4.0–10.5)

## 2014-12-20 LAB — PROTIME-INR
INR: 1.15 (ref 0.00–1.49)
PROTHROMBIN TIME: 14.9 s (ref 11.6–15.2)

## 2014-12-20 LAB — APTT: APTT: 30 s (ref 24–37)

## 2014-12-20 MED ORDER — LIDOCAINE HCL 1 % IJ SOLN
INTRAMUSCULAR | Status: AC
  Filled 2014-12-20: qty 20

## 2014-12-20 MED ORDER — MIDAZOLAM HCL 2 MG/2ML IJ SOLN
INTRAMUSCULAR | Status: AC | PRN
  Administered 2014-12-20: 1 mg via INTRAVENOUS

## 2014-12-20 MED ORDER — SODIUM CHLORIDE 0.9 % IV SOLN
INTRAVENOUS | Status: AC | PRN
  Administered 2014-12-20: 10 mL/h via INTRAVENOUS

## 2014-12-20 MED ORDER — FENTANYL CITRATE (PF) 100 MCG/2ML IJ SOLN
INTRAMUSCULAR | Status: AC
  Filled 2014-12-20: qty 4

## 2014-12-20 MED ORDER — MIDAZOLAM HCL 2 MG/2ML IJ SOLN
INTRAMUSCULAR | Status: AC
  Filled 2014-12-20: qty 4

## 2014-12-20 MED ORDER — FENTANYL CITRATE (PF) 100 MCG/2ML IJ SOLN
INTRAMUSCULAR | Status: AC | PRN
  Administered 2014-12-20 (×2): 50 ug via INTRAVENOUS

## 2014-12-20 NOTE — Sedation Documentation (Signed)
Patient is resting comfortably. 

## 2014-12-20 NOTE — Progress Notes (Signed)
7 Days Post-Op  Subjective: Dale Crane - Interpreter Pt is doing well less pain this Am drain is clear. For IR procedure.  We talked about it and he is ready.   Objective: Vital signs in last 24 hours: Temp:  [98.6 F (37 C)-99.2 F (37.3 C)] 98.7 F (37.1 C) (11/24 0531) Pulse Rate:  [75-92] 92 (11/24 0531) Resp:  [16] 16 (11/24 0531) BP: (98-115)/(57-66) 98/57 mmHg (11/24 0531) SpO2:  [96 %-98 %] 97 % (11/24 0531) Last BM Date: 12/16/14 Soft Diet, npo for procedure 5 ml from drain  1BM TM 99.2 WBC 13.6 For IR drain todaty Intake/Output from previous day: 11/23 0701 - 11/24 0700 In: 4696.7 [P.O.:720; I.V.:3976.7] Out: 5 [Drains:5] Intake/Output this shift:    General appearance: alert, cooperative and no distress Resp: clear to auscultation bilaterally GI: soft, non-tender; bowel sounds normal; no masses,  no organomegaly and drain fluid is clear/serous  Lab Results:   Recent Labs  12/19/14 0251 12/20/14 0225  WBC 15.6* 13.6*  HGB 12.9* 13.6  HCT 38.0* 39.9  PLT 314 371    BMET  Recent Labs  12/18/14 0308 12/20/14 0225  NA 134* 134*  K 3.8 4.1  CL 102 98*  CO2 22 27  GLUCOSE 117* 99  BUN 9 7  CREATININE 0.73 0.74  CALCIUM 8.9 8.9   PT/INR  Recent Labs  12/20/14 0225  LABPROT 14.9  INR 1.15    No results for input(s): AST, ALT, ALKPHOS, BILITOT, PROT, ALBUMIN in the last 168 hours.   Lipase     Component Value Date/Time   LIPASE 25 12/13/2014 0024     Studies/Results: Ct Abdomen Pelvis W Contrast  12/19/2014  CLINICAL DATA:  Persistent mid abdominal pain, status post ruptured appendicitis EXAM: CT ABDOMEN AND PELVIS WITH CONTRAST TECHNIQUE: Multidetector CT imaging of the abdomen and pelvis was performed using the standard protocol following bolus administration of intravenous contrast. CONTRAST:  100mL OMNIPAQUE IOHEXOL 300 MG/ML  SOLN COMPARISON:  12/13/2014 FINDINGS: There is atelectasis or infiltrate in right lower lobe posteriorly with  some air bronchogram. Contracted gallbladder is noted. Small amount of perihepatic inferior ascites. There is a collection adjacent to inferior aspect of the liver with air-fluid level and high-density material centrally probable migrated appendicolith. This is highly suspicious of subcapsular abscess measures 4.6 by 3.1 cm. There is small amount of fluid in right paracolic gutter. Persistent mild inflammatory changes in right lower quadrant post rupture appendicitis. A percutaneous drain is noted in right lower quadrant. Small residual abscess in right lower quadrant measures 2.5 by 2.8 cm. Small probable reactive lymph nodes are noted in right lower quadrant mesentery. There is no evidence of small bowel obstruction. Kidneys are symmetrical in size and enhancement. No hydronephrosis or hydroureter. Small amount of air probable post instrumentation noted within urinary bladder. There is a small collection within pelvis just posterior with urinary bladder with enhancing wall measures 4.3 by 3 cm suspicious for abscess. Axial image 43 there is small amount of soft tissue air within right flank wall. This is probable post surgical in nature. Please see also axial image 53. IMPRESSION: 1. There is a collection adjacent to inferior aspect of the liver with air-fluid level and high-density material centrally probable migrated appendicolith. This is highly suspicious of subcapsular abscess measures 4.6 by 3.1 cm. 2. Persistent inflammatory changes in right lower quadrant post ruptured appendicitis. A percutaneous drain is noted in right lower quadrant. Small residual abscess in right lower quadrant measures 2.5 x  2.8 cm. Reactive mesenteric lymph nodes are noted in right lower quadrant. 3. There is a pelvic collection just posterior to urinary bladder with enhancing wall measures 4.3 x 3 cm suspicious for pelvic abscess. 4. No small bowel obstruction. 5. No hydronephrosis or hydroureter. Electronically Signed   By: Natasha Mead M.D.   On: 12/19/2014 12:21    Medications: . [START ON 12/21/2014] enoxaparin (LOVENOX) injection  40 mg Subcutaneous Q24H  . piperacillin-tazobactam (ZOSYN)  IV  3.375 g Intravenous 3 times per day    Assessment/Plan Perforated appendicitis S/p APPENDECTOMY LAPAROSCOPIC FOR RUPTURED APPENDIX, LAPAROSCOPIC LYSIS OF ADHESIONS 45 MINUTES; 12/13/14, Dr. Violeta Gelinas POD4 2 prior surgeries in [place] for appendicitis Post op ileus  Antibiotics: Day 8 Zosyn , Augmentin 12/18/14 back to Zosyn 12/19/14  DVT: Lovenox/SCD    Plan:  Continue with antibiotics, and IR today.   LOS: 7 days    Dale Crane 12/20/2014

## 2014-12-20 NOTE — Procedures (Signed)
Interventional Radiology Procedure Note  Procedure: CT guided aspiration of right upper abdominal abscess adjacent to fecolith/appendicolith.  30cc of foul-smelling dark fluid aspirated and sent for culture.  Prone position CT shows decreasing size of pelvic fluid compared to C+ CT performed prior day.  No safe window on the current CT.    Complications: None Recommendations:  - Agree with current management - follow up culture - Routine wound care   Signed,  Yvone NeuJaime S. Loreta AveWagner, DO

## 2014-12-20 NOTE — Sedation Documentation (Signed)
Patient denies pain and is resting comfortably.  

## 2014-12-21 LAB — CBC
HCT: 41.7 % (ref 39.0–52.0)
Hemoglobin: 14.1 g/dL (ref 13.0–17.0)
MCH: 27.3 pg (ref 26.0–34.0)
MCHC: 33.8 g/dL (ref 30.0–36.0)
MCV: 80.7 fL (ref 78.0–100.0)
PLATELETS: 416 10*3/uL — AB (ref 150–400)
RBC: 5.17 MIL/uL (ref 4.22–5.81)
RDW: 12.7 % (ref 11.5–15.5)
WBC: 11.8 10*3/uL — ABNORMAL HIGH (ref 4.0–10.5)

## 2014-12-21 MED ORDER — AMOXICILLIN-POT CLAVULANATE 875-125 MG PO TABS
1.0000 | ORAL_TABLET | Freq: Two times a day (BID) | ORAL | Status: DC
  Administered 2014-12-21 – 2014-12-24 (×7): 1 via ORAL
  Filled 2014-12-21 (×7): qty 1

## 2014-12-21 MED ORDER — SACCHAROMYCES BOULARDII 250 MG PO CAPS
250.0000 mg | ORAL_CAPSULE | Freq: Two times a day (BID) | ORAL | Status: DC
Start: 2014-12-21 — End: 2014-12-28
  Administered 2014-12-21 – 2014-12-28 (×14): 250 mg via ORAL
  Filled 2014-12-21 (×14): qty 1

## 2014-12-21 NOTE — Progress Notes (Signed)
Interpreter [name] Namihira for patient °

## 2014-12-21 NOTE — Care Management Note (Signed)
Case Management Note  Patient Details  Name: Dale Crane MRN: [id number] Date of Birth: 01/11/1986  Subjective/Objective:                    Action/Plan:   Expected Discharge Date:                  Expected Discharge Plan:  Home/Self Care  In-House Referral:     Discharge planning Services  CM Consult, Indigent Health Clinic, [facility], Medication Assistance  Post Acute Care Choice:    Choice offered to:     DME Arranged:    DME Agency:     HH Arranged:    HH Agency:     Status of Service:  Completed, signed off  Medicare Important Message Given:    Date Medicare IM Given:    Medicare IM give by:    Date Additional Medicare IM Given:    Additional Medicare Important Message give by:     If discussed at Long Length of Stay Meetings, dates discussed:    Additional Comments: UR updated  Kingsley PlanWile, Marvelous Woolford Marie, RN 12/21/2014, 8:36 AM

## 2014-12-21 NOTE — Progress Notes (Signed)
8 Days Post-Op  Subjective: He looks and feels fine, not even complaining of pain around the drain now.   Interpreter:  Efraim KaufmannMelissa [phone] Objective: Vital signs in last 24 hours: Temp:  [97.7 F (36.5 C)-98.4 F (36.9 C)] 97.7 F (36.5 C) (11/25 29520632) Pulse Rate:  [75-94] 75 (11/25 0632) Resp:  [15-18] 18 (11/25 0632) BP: (110-120)/(50-70) 112/53 mmHg (11/25 0632) SpO2:  [97 %-100 %] 97 % (11/25 0632) Last BM Date: 12/20/14 600 PO recorded Afebrile, VSS WBC 11.8 continues to improve CT drainage as noted below Culture pending Intake/Output from previous day: 11/24 0701 - 11/25 0700 In: 1237.5 [P.O.:600; I.V.:437.5; IV Piggyback:200] Out: 5 [Drains:5] Intake/Output this shift:    General appearance: alert, cooperative and no distress Resp: clear to auscultation bilaterally GI: soft, non-tender; bowel sounds normal; no masses,  no organomegaly  Lab Results:   Recent Labs  12/20/14 0225 12/21/14 0421  WBC 13.6* 11.8*  HGB 13.6 14.1  HCT 39.9 41.7  PLT 371 416*    BMET  Recent Labs  12/20/14 0225  NA 134*  K 4.1  CL 98*  CO2 27  GLUCOSE 99  BUN 7  CREATININE 0.74  CALCIUM 8.9   PT/INR  Recent Labs  12/20/14 0225  LABPROT 14.9  INR 1.15    No results for input(s): AST, ALT, ALKPHOS, BILITOT, PROT, ALBUMIN in the last 168 hours.   Lipase     Component Value Date/Time   LIPASE 25 12/13/2014 0024     Studies/Results: Ct Abdomen Pelvis W Contrast  12/19/2014  CLINICAL DATA:  Persistent mid abdominal pain, status post ruptured appendicitis EXAM: CT ABDOMEN AND PELVIS WITH CONTRAST TECHNIQUE: Multidetector CT imaging of the abdomen and pelvis was performed using the standard protocol following bolus administration of intravenous contrast. CONTRAST:  100mL OMNIPAQUE IOHEXOL 300 MG/ML  SOLN COMPARISON:  12/13/2014 FINDINGS: There is atelectasis or infiltrate in right lower lobe posteriorly with some air bronchogram. Contracted gallbladder is noted. Small  amount of perihepatic inferior ascites. There is a collection adjacent to inferior aspect of the liver with air-fluid level and high-density material centrally probable migrated appendicolith. This is highly suspicious of subcapsular abscess measures 4.6 by 3.1 cm. There is small amount of fluid in right paracolic gutter. Persistent mild inflammatory changes in right lower quadrant post rupture appendicitis. A percutaneous drain is noted in right lower quadrant. Small residual abscess in right lower quadrant measures 2.5 by 2.8 cm. Small probable reactive lymph nodes are noted in right lower quadrant mesentery. There is no evidence of small bowel obstruction. Kidneys are symmetrical in size and enhancement. No hydronephrosis or hydroureter. Small amount of air probable post instrumentation noted within urinary bladder. There is a small collection within pelvis just posterior with urinary bladder with enhancing wall measures 4.3 by 3 cm suspicious for abscess. Axial image 43 there is small amount of soft tissue air within right flank wall. This is probable post surgical in nature. Please see also axial image 53. IMPRESSION: 1. There is a collection adjacent to inferior aspect of the liver with air-fluid level and high-density material centrally probable migrated appendicolith. This is highly suspicious of subcapsular abscess measures 4.6 by 3.1 cm. 2. Persistent inflammatory changes in right lower quadrant post ruptured appendicitis. A percutaneous drain is noted in right lower quadrant. Small residual abscess in right lower quadrant measures 2.5 x 2.8 cm. Reactive mesenteric lymph nodes are noted in right lower quadrant. 3. There is a pelvic collection just posterior to urinary bladder  with enhancing wall measures 4.3 x 3 cm suspicious for pelvic abscess. 4. No small bowel obstruction. 5. No hydronephrosis or hydroureter. Electronically Signed   By: Natasha Mead M.D.   On: 12/19/2014 12:21   Ct  Aspiration  12/20/2014  CLINICAL DATA:  29 year old male with a history of ruptured appendicitis, now with abscess. EXAM: CT GUIDED ASPIRATION BIOPSY OF RIGHT UPPER QUADRANT ABSCESS ANESTHESIA/SEDATION: 1.0  Mg IV Versed; 100 mcg IV Fentanyl Total Moderate Sedation Time: 10 minutes. PROCEDURE: The procedure risks, benefits, and alternatives were explained to the patient. Questions regarding the procedure were encouraged and answered. The patient understands and consents to the procedure. Patient positioned in the prone position on the CT gantry table and a scout CT was performed of the pelvis. Patient was then positioned supine position on the CT gantry and a scout CT was performed of the upper abdomen. The right upper quadrant was prepped with Betadinein a sterile fashion, and a sterile drape was applied covering the operative field. A sterile gown and sterile gloves were used for the procedure. Local anesthesia was provided with 1% Lidocaine. Once the patient is prepped and draped in the usual sterile fashion, the skin and subcutaneous tissues were generously infiltrated with 1% lidocaine for local anesthesia. Using CT guidance, a Yueh needle was advanced into a suspected abscess of the right upper quadrant at the inferior liver margin. Once the Yueh needle was confirmed in position, the catheter was advanced off the needle, and approximately 30 cc of brown, foul smelling fluid was aspirated from the cavity. Sterile flush was infused an aspirated. A sample was sent to the lab for analysis. Patient tolerated the procedure well and remained hemodynamically stable throughout. No complications were encountered and no significant blood loss was encountered. FINDINGS: Initial scout CT in the prone position demonstrated smaller appearance of the pelvic fluid collection identified on prior CT study. There was no safe window for approach for aspiration. CT of the abdomen in supine position demonstrates air can fluid  collection at the inferior liver margin with radiopaque stone within the collection. Images during the case demonstrate placement of UE needle into the cavity for aspiration. Approximately 30 cc of brown foul smelling fluid were aspirated and sent for lab analysis. IMPRESSION: Status post CT-guided aspiration of right upper quadrant fluid collection with 30 cc of abscess fluid removed. Sample was sent the lab for analysis. The scout CT of the pelvis demonstrates a smaller fluid collection compared to the prior CT, with no safe window for access for aspiration. Signed, Yvone Neu. Loreta Ave, DO [facility] Electronically Signed   By: Gilmer Mor D.O.   On: 12/20/2014 19:41    Medications: . enoxaparin (LOVENOX) injection  40 mg Subcutaneous Q24H  . piperacillin-tazobactam (ZOSYN)  IV  3.375 g Intravenous 3 times per day    Assessment/Plan Perforated appendicitis S/p APPENDECTOMY LAPAROSCOPIC FOR RUPTURED APPENDIX, LAPAROSCOPIC LYSIS OF ADHESIONS 45 MINUTES; 12/13/14, Dr. Violeta Gelinas POD7 2 prior surgeries in [place] for appendicitis CT guided aspiration of right upper abdominal abscess adjacent to fecolith/appendicolith. 30cc of foul-smelling dark fluid aspirated and sent for culture.12/20/14, Dr. Ruthy Dick Post op ileus  Antibiotics: Day 8 Zosyn , Augmentin 12/18/14 back to Zosyn 12/19/14 now Day 9 today DVT: Lovenox/SCD   Plan:  Go back to soft diet, PO augmentin and see how he does, ? D/c home tomorrow on another week of antibiotics.     LOS: 8 days    Anet [place]  12/21/2014   

## 2014-12-22 LAB — CBC
HCT: 42.5 % (ref 39.0–52.0)
HEMOGLOBIN: 14.6 g/dL (ref 13.0–17.0)
MCH: 27.7 pg (ref 26.0–34.0)
MCHC: 34.4 g/dL (ref 30.0–36.0)
MCV: 80.6 fL (ref 78.0–100.0)
PLATELETS: 457 10*3/uL — AB (ref 150–400)
RBC: 5.27 MIL/uL (ref 4.22–5.81)
RDW: 12.7 % (ref 11.5–15.5)
WBC: 13.2 10*3/uL — ABNORMAL HIGH (ref 4.0–10.5)

## 2014-12-22 LAB — COMPREHENSIVE METABOLIC PANEL
ALBUMIN: 3.5 g/dL (ref 3.5–5.0)
ALT: 125 U/L — AB (ref 17–63)
AST: 51 U/L — AB (ref 15–41)
Alkaline Phosphatase: 80 U/L (ref 38–126)
Anion gap: 7 (ref 5–15)
BILIRUBIN TOTAL: 0.3 mg/dL (ref 0.3–1.2)
BUN: 9 mg/dL (ref 6–20)
CHLORIDE: 100 mmol/L — AB (ref 101–111)
CO2: 27 mmol/L (ref 22–32)
Calcium: 9.2 mg/dL (ref 8.9–10.3)
Creatinine, Ser: 0.76 mg/dL (ref 0.61–1.24)
GFR calc Af Amer: >60 mL/min (ref >60)
GFR calc non Af Amer: >60 mL/min (ref >60)
Glucose, Bld: 94 mg/dL (ref 65–99)
POTASSIUM: 4.5 mmol/L (ref 3.5–5.1)
Sodium: 134 mmol/L — ABNORMAL LOW (ref 135–145)
TOTAL PROTEIN: 7.3 g/dL (ref 6.5–8.1)

## 2014-12-22 NOTE — Progress Notes (Signed)
9 Days Post-Op  Subjective: He feels great, but drainage from the OR drain is cloudy this AM, and brown in color.  The culture is showing multiple organisms from IR drain site.    Objective: Vital signs in last 24 hours: Temp:  [97.9 F (36.6 C)-98.3 F (36.8 C)] 98.2 F (36.8 C) (11/26 0500) Pulse Rate:  [71-85] 79 (11/26 0500) Resp:  [16-18] 16 (11/26 0500) BP: (108-122)/(62-70) 111/65 mmHg (11/26 0500) SpO2:  [96 %-98 %] 96 % (11/26 0500) Last BM Date: 12/20/14 1020 Po 0 from drain Afebrile, VSS Labs OK,   WBC 13.2  Intake/Output from previous day: 11/25 0701 - 11/26 0700 In: 1570 [P.O.:1020; I.V.:550] Out: 0  Intake/Output this shift:    General appearance: alert, cooperative and no distress Resp: clear to auscultation bilaterally GI: soft, still a little tender over the drain site.  he had cloudy brownish colored drainage from the site this Am.  Lab Results:   Recent Labs  12/21/14 0421 12/22/14 0454  WBC 11.8* 13.2*  HGB 14.1 14.6  HCT 41.7 42.5  PLT 416* 457*    BMET  Recent Labs  12/20/14 0225 12/22/14 0454  NA 134* 134*  K 4.1 4.5  CL 98* 100*  CO2 27 27  GLUCOSE 99 94  BUN 7 9  CREATININE 0.74 0.76  CALCIUM 8.9 9.2   PT/INR  Recent Labs  12/20/14 0225  LABPROT 14.9  INR 1.15     Recent Labs Lab 12/22/14 0454  AST 51*  ALT 125*  ALKPHOS 80  BILITOT 0.3  PROT 7.3  ALBUMIN 3.5     Lipase     Component Value Date/Time   LIPASE 25 12/13/2014 0024     Studies/Results: Ct Aspiration  12/20/2014  CLINICAL DATA:  29 year old male with a history of ruptured appendicitis, now with abscess. EXAM: CT GUIDED ASPIRATION BIOPSY OF RIGHT UPPER QUADRANT ABSCESS ANESTHESIA/SEDATION: 1.0  Mg IV Versed; 100 mcg IV Fentanyl Total Moderate Sedation Time: 10 minutes. PROCEDURE: The procedure risks, benefits, and alternatives were explained to the patient. Questions regarding the procedure were encouraged and answered. The patient understands  and consents to the procedure. Patient positioned in the prone position on the CT gantry table and a scout CT was performed of the pelvis. Patient was then positioned supine position on the CT gantry and a scout CT was performed of the upper abdomen. The right upper quadrant was prepped with Betadinein a sterile fashion, and a sterile drape was applied covering the operative field. A sterile gown and sterile gloves were used for the procedure. Local anesthesia was provided with 1% Lidocaine. Once the patient is prepped and draped in the usual sterile fashion, the skin and subcutaneous tissues were generously infiltrated with 1% lidocaine for local anesthesia. Using CT guidance, a Yueh needle was advanced into a suspected abscess of the right upper quadrant at the inferior liver margin. Once the Yueh needle was confirmed in position, the catheter was advanced off the needle, and approximately 30 cc of brown, foul smelling fluid was aspirated from the cavity. Sterile flush was infused an aspirated. A sample was sent to the lab for analysis. Patient tolerated the procedure well and remained hemodynamically stable throughout. No complications were encountered and no significant blood loss was encountered. FINDINGS: Initial scout CT in the prone position demonstrated smaller appearance of the pelvic fluid collection identified on prior CT study. There was no safe window for approach for aspiration. CT of the abdomen in supine position  demonstrates air can fluid collection at the inferior liver margin with radiopaque stone within the collection. Images during the case demonstrate placement of UE needle into the cavity for aspiration. Approximately 30 cc of brown foul smelling fluid were aspirated and sent for lab analysis. IMPRESSION: Status post CT-guided aspiration of right upper quadrant fluid collection with 30 cc of abscess fluid removed. Sample was sent the lab for analysis. The scout CT of the pelvis demonstrates a  smaller fluid collection compared to the prior CT, with no safe window for access for aspiration. Signed, Yvone NeuJaime S. Loreta AveWagner, DO [facility] Electronically Signed   By: Gilmer MorJaime  Wagner D.O.   On: 12/20/2014 19:41    Medications: . amoxicillin-clavulanate  1 tablet Oral Q12H  . enoxaparin (LOVENOX) injection  40 mg Subcutaneous Q24H  . saccharomyces boulardii  250 mg Oral BID    Assessment/Plan Perforated appendicitis S/p APPENDECTOMY LAPAROSCOPIC FOR RUPTURED APPENDIX, LAPAROSCOPIC LYSIS OF ADHESIONS 45 MINUTES; 12/13/14, Dr. Violeta GelinasBurke Thompson POD7 2 prior [place] CT guided aspiration of right upper abdominal abscess adjacent to fecolith/appendicolith. 30cc of foul-smelling dark fluid aspirated and sent for culture.12/20/14, Dr. Ruthy DickJamie Wagner Post op ileus  Antibiotics: Day 8 Zosyn , Augmentin 12/18/14 back to Zosyn 12/19/14 now Day 9 today (day 11 antibiotics today) DVT: Lovenox/SCD  Plan:  I was hoping to send him home again today, but drainage from old drain is now cloudy and brownish in color.  His exam is not impressive.  WBC still up some, now on Augmentin again this would be day 2.  I think we should hold him today.     LOS: 9 days    Lexie Morini 12/22/2014

## 2014-12-23 LAB — CBC
HCT: 39.5 % (ref 39.0–52.0)
HEMOGLOBIN: 13.2 g/dL (ref 13.0–17.0)
MCH: 26.9 pg (ref 26.0–34.0)
MCHC: 33.4 g/dL (ref 30.0–36.0)
MCV: 80.4 fL (ref 78.0–100.0)
PLATELETS: 437 10*3/uL — AB (ref 150–400)
RBC: 4.91 MIL/uL (ref 4.22–5.81)
RDW: 12.6 % (ref 11.5–15.5)
WBC: 13.8 10*3/uL — AB (ref 4.0–10.5)

## 2014-12-23 NOTE — Clinical Documentation Improvement (Signed)
General Surgery  Please clarify if sepsis was ruled in or out.  ED note states appendicitis with sepsis; no further documentation of sepsis in other notes.   Please exercise your independent, professional judgment when responding. A specific answer is not anticipated or expected.Please update your documentation within the medical record to reflect your response to this query.  Thank you, Doy MinceVangela Mesa Janus, RN [phone] Clinical Documentation Specialist

## 2014-12-23 NOTE — Progress Notes (Signed)
10 Days Post-Op  Subjective: He is fine and putting up with us.  He feels fine, he isn't taking pain pills at all at this point. Fluid in the bulb is still cloudy.  Objective: Vital signs in last 24 hours: Temp:  [98 F (36.7 C)-98.5 F (36.9 C)] 98.5 F (36.9 C) (11/27 0506) Pulse Rate:  [83-92] 88 (11/27 0506) Resp:  [16] 16 (11/27 0506) BP: (114-116)/(66-68) 116/66 mmHg (11/27 0506) SpO2:  [93 %-98 %] 98 % (11/27 0506) Last BM Date: 12/22/14  Intake/Output from previous day: 11/26 0701 - 11/27 0700 In: 3326 [I.V.:3326] Out: 5 [Drains:5] Intake/Output this shift:    General appearance: alert, cooperative and no distress GI: soft, non-tender; bowel sounds normal; no masses,  no organomegaly and soreness over drain site is also better.  Lab Results:   Recent Labs  12/22/14 0454 12/23/14 0456  WBC 13.2* 13.8*  HGB 14.6 13.2  HCT 42.5 39.5  PLT 457* 437*    BMET  Recent Labs  12/22/14 0454  NA 134*  K 4.5  CL 100*  CO2 27  GLUCOSE 94  BUN 9  CREATININE 0.76  CALCIUM 9.2   PT/INR No results for input(s): LABPROT, INR in the last 72 hours.   Recent Labs Lab 12/22/14 0454  AST 51*  ALT 125*  ALKPHOS 80  BILITOT 0.3  PROT 7.3  ALBUMIN 3.5     Lipase     Component Value Date/Time   LIPASE 25 12/13/2014 0024     Studies/Results: No results found.  Medications: . amoxicillin-clavulanate  1 tablet Oral Q12H  . enoxaparin (LOVENOX) injection  40 mg Subcutaneous Q24H  . saccharomyces boulardii  250 mg Oral BID    Assessment/Plan Perforated appendicitis S/p APPENDECTOMY LAPAROSCOPIC FOR RUPTURED APPENDIX, LAPAROSCOPIC LYSIS OF ADHESIONS 45 MINUTES; 12/13/14, Dr. Violeta GelinasBurke Thompson POD7 2 prior [place] Post op sepsis/peritonitis CT guided aspiration of right upper abdominal abscess adjacent to fecolith/appendicolith. 30cc of foul-smelling dark fluid aspirated and sent for culture.12/20/14, Dr. Ruthy DickJamie Wagner Post op  ileus  Antibiotics: Day 8 Zosyn , Augmentin 12/18/14 back to Zosyn 12/19/14 now Day 9 today (day 12 antibiotics today) DVT: Lovenox/SCD    Plan:  Watch today, and see how WBC goes in AM.  Discuss repeat CT scan tomorrow if WBC is up.    LOS: 10 days    Undra Trembath 12/23/2014

## 2014-12-24 LAB — CBC
HCT: 42.3 % (ref 39.0–52.0)
HEMOGLOBIN: 14.5 g/dL (ref 13.0–17.0)
MCH: 27.5 pg (ref 26.0–34.0)
MCHC: 34.3 g/dL (ref 30.0–36.0)
MCV: 80.3 fL (ref 78.0–100.0)
PLATELETS: 455 10*3/uL — AB (ref 150–400)
RBC: 5.27 MIL/uL (ref 4.22–5.81)
RDW: 12.6 % (ref 11.5–15.5)
WBC: 15.2 10*3/uL — ABNORMAL HIGH (ref 4.0–10.5)

## 2014-12-24 LAB — CULTURE, ROUTINE-ABSCESS

## 2014-12-24 MED ORDER — CEFPODOXIME PROXETIL 200 MG PO TABS
400.0000 mg | ORAL_TABLET | Freq: Two times a day (BID) | ORAL | Status: DC
  Administered 2014-12-24 – 2014-12-25 (×3): 400 mg via ORAL
  Filled 2014-12-24 (×5): qty 2

## 2014-12-24 NOTE — Progress Notes (Signed)
11 Days Post-Op  Subjective: He looks great, no pain almost nothing in drain,but what is there is a little turbid.  No other complaints.  Objective: Vital signs in last 24 hours: Temp:  [97.6 F (36.4 C)-98.7 F (37.1 C)] 98.7 F (37.1 C) (11/28 0620) Pulse Rate:  [89-92] 89 (11/28 0620) Resp:  [17-18] 18 (11/28 0620) BP: (104-124)/(62-72) 112/72 mmHg (11/28 0620) SpO2:  [90 %-98 %] 90 % (11/28 0620) Last BM Date: 12/22/14 640 PO + BM Afebrile, VSS CBC is pending  Intake/Output from previous day: 11/27 0701 - 11/28 0700 In: 1862 [P.O.:640; I.V.:1222] Out: 303 [Urine:300; Drains:3] Intake/Output this shift:    General appearance: alert, cooperative and no distress GI: soft, non-tender; bowel sounds normal; no masses,  no organomegaly and no pain at drain site currently either. sites all looked good.  Lab Results:   Recent Labs  12/23/14 0456 12/24/14 1044  WBC 13.8* 15.2*  HGB 13.2 14.5  HCT 39.5 42.3  PLT 437* 455*    BMET  Recent Labs  12/22/14 0454  NA 134*  K 4.5  CL 100*  CO2 27  GLUCOSE 94  BUN 9  CREATININE 0.76  CALCIUM 9.2   PT/INR No results for input(s): LABPROT, INR in the last 72 hours.   Recent Labs Lab 12/22/14 0454  AST 51*  ALT 125*  ALKPHOS 80  BILITOT 0.3  PROT 7.3  ALBUMIN 3.5     Lipase     Component Value Date/Time   LIPASE 25 12/13/2014 0024     Studies/Results: No results found.  Medications: . cefpodoxime  400 mg Oral Q12H  . enoxaparin (LOVENOX) injection  40 mg Subcutaneous Q24H  . saccharomyces boulardii  250 mg Oral BID    Assessment/Plan Perforated appendicitis S/p APPENDECTOMY LAPAROSCOPIC FOR RUPTURED APPENDIX, LAPAROSCOPIC LYSIS OF ADHESIONS 45 MINUTES; 12/13/14, Dr. Violeta GelinasBurke Thompson POD 11 2 prior surgeries [place] for [place] Post op sepsis/peritonitis CT guided aspiration of right upper abdominal abscess adjacent to fecolith/appendicolith. 30cc of foul-smelling dark fluid aspirated and  sent for culture.12/20/14, Dr. Ruthy DickJamie Wagner Post op ileus  Antibiotics: Day 8 Zosyn , Augmentin 12/18/14 back to Zosyn 12/19/14  (day 12 antibiotics today) DVT: Lovenox/SCD  Plan:  Get the CBC and see if we can send him home on antibiotics and repeat CT and follow up in clinic next week. Culture below is just back this is from the site drained last week by IR, Augmentin is not a good choice, and I am changing him to Vantin (3rd generation oral cephalosporin) WBC is still going up, I will discuss with Dr. Derrell Lollingamirez.   ESCHERICHIA COLI     Antibiotic Sensitivity Microscan Status    AMPICILLIN Resistant >=32 RESISTANT Final    Method: MIC    AMPICILLIN/SULBACTAM Intermediate 16 INTERMEDIATE Final    Method: MIC    CEFEPIME Sensitive <=1 SENSITIVE Final    Method: MIC    CEFTAZIDIME Sensitive <=1 SENSITIVE Final    Method: MIC    CEFTRIAXONE Sensitive <=1 SENSITIVE Final    Method: MIC    CIPROFLOXACIN Resistant >=4 RESISTANT Final    Method: MIC    GENTAMICIN Sensitive <=1 SENSITIVE Final    Method: MIC    IMIPENEM Sensitive <=0.25 SENSITIVE Final    Method: MIC    PIP/TAZO Sensitive <=4 SENSITIVE Final    Method: MIC    TOBRAMYCIN Sensitive <=1 SENSITIVE Final    Method: MIC    TRIMETH/SULFA Resistant >=320 RESISTANT (NOTE) Final  LOS: 11 days    Harmon Bommarito 12/24/2014

## 2014-12-24 NOTE — Progress Notes (Signed)
Interpreter [name] Namihira for Patient °

## 2014-12-25 ENCOUNTER — Inpatient Hospital Stay (HOSPITAL_COMMUNITY): Payer: Self-pay

## 2014-12-25 ENCOUNTER — Encounter (HOSPITAL_COMMUNITY): Payer: Self-pay | Admitting: *Deleted

## 2014-12-25 LAB — CBC
HCT: 41.9 % (ref 39.0–52.0)
Hemoglobin: 14.2 g/dL (ref 13.0–17.0)
MCH: 27 pg (ref 26.0–34.0)
MCHC: 33.9 g/dL (ref 30.0–36.0)
MCV: 79.8 fL (ref 78.0–100.0)
PLATELETS: 460 10*3/uL — AB (ref 150–400)
RBC: 5.25 MIL/uL (ref 4.22–5.81)
RDW: 12.7 % (ref 11.5–15.5)
WBC: 18.1 10*3/uL — AB (ref 4.0–10.5)

## 2014-12-25 MED ORDER — SODIUM CHLORIDE 0.9 % IV SOLN
1.0000 g | INTRAVENOUS | Status: DC
  Administered 2014-12-25 – 2014-12-27 (×3): 1 g via INTRAVENOUS
  Filled 2014-12-25 (×4): qty 1

## 2014-12-25 MED ORDER — ENOXAPARIN SODIUM 40 MG/0.4ML ~~LOC~~ SOLN
40.0000 mg | SUBCUTANEOUS | Status: DC
  Administered 2014-12-27 – 2014-12-28 (×2): 40 mg via SUBCUTANEOUS
  Filled 2014-12-25 (×2): qty 0.4

## 2014-12-25 MED ORDER — BARIUM SULFATE 2.1 % PO SUSP
ORAL | Status: AC
  Administered 2014-12-25: 900 mL
  Filled 2014-12-25: qty 1

## 2014-12-25 MED ORDER — IOHEXOL 300 MG/ML  SOLN
100.0000 mL | Freq: Once | INTRAMUSCULAR | Status: AC | PRN
  Administered 2014-12-25: 100 mL via INTRAVENOUS

## 2014-12-25 NOTE — Progress Notes (Signed)
Interpreter [name] Namihira for Lindsey RN  °

## 2014-12-25 NOTE — Progress Notes (Signed)
Patient ID: Dale Crane, male   DOB: [birth year], 29 y.o.   MRN: [id number]    Referring Physician(s): [facility] Sherrie George PA-C  Chief Complaint: Abdominal fluid collection   Subjective: Patient complains of ongoing RUQ pain. He denies any chest pain, shortness of breath or palpitations. The patient denies any history of sleep apnea or chronic oxygen use. He has previously tolerated sedation without complications. CT today revealed RUQ fluid collection increased in size concern for abscess.    Allergies: Review of patient's allergies indicates no known allergies.  Medications: Prior to Admission medications   Not on File    Vital Signs: BP 121/50 mmHg  Pulse 100  Temp(Src) 99.8 F (37.7 C) (Oral)  Resp 20  Ht  (1.727 m)  Wt 215 lb 9.8 oz (97.8 kg)  BMI 32.79 kg/m2  SpO2 97%  Physical Exam  Constitutional: He is oriented to person, place, and time. No distress.  HENT:  Head: Normocephalic and atraumatic.  Cardiovascular: Normal rate and regular rhythm.  Exam reveals no gallop and no friction rub.   No murmur heard. Pulmonary/Chest: Effort normal and breath sounds normal. No respiratory distress. He has no wheezes. He has no rales.  Abdominal: Soft.  RUQ TTP, RLQ surgical drain intact with green output 20 cc  Neurological: He is alert and oriented to person, place, and time.  Skin: Skin is warm and dry. He is not diaphoretic.    Imaging: Ct Abdomen Pelvis W Contrast  12/25/2014  CLINICAL DATA:  Ruptured appendix. EXAM: CT ABDOMEN AND PELVIS WITH CONTRAST TECHNIQUE: Multidetector CT imaging of the abdomen and pelvis was performed using the standard protocol following bolus administration of intravenous contrast. CONTRAST:  OMNIPAQUE IOHEXOL 300 MG/ML  SOLN COMPARISON:  CT scan of December 19, 2014. FINDINGS: Mild bilateral posterior basilar subsegmental atelectasis is noted. No significant osseous abnormality is noted. No gallstones are noted. The  spleen and pancreas appear normal. Fluid and air collection noted along inferior margin of right hepatic lobe is significantly larger currently, now measuring 8.2 x 4.0 cm. Calculus remains within it concerning for appendicolith. Adrenal glands and kidneys appear normal. No hydronephrosis or renal obstruction is noted. Surgical drain remains in the right lower quadrant of the abdomen with very small fluid collection remaining, which is significantly smaller compared to prior exam. Fluid collection seen in the pelvis on prior exam is also significantly smaller, measuring 3.1 x 1.2 cm. There is no evidence of bowel obstruction. Urinary bladder appears normal. No significant adenopathy is noted. IMPRESSION: Fluid collections seen in the right lower quadrant of the abdomen as well as in the pelvis are significantly smaller compared to prior exam. However, air and fluid collection seen inferior to right hepatic lobe on prior exam is significantly larger currently, concerning for abscess. These results will be called to the ordering clinician or representative by the Radiologist Assistant, and communication documented in the PACS or zVision Dashboard. Electronically Signed   By: Lupita Raider, M.D.   On: 12/25/2014 15:32    Labs:  CBC:  Recent Labs  12/22/14 0454 12/23/14 0456 12/24/14 1044 12/25/14 0638  WBC 13.2* 13.8* 15.2* 18.1*  HGB 14.6 13.2 14.5 14.2  HCT 42.5 39.5 42.3 41.9  PLT 457* 437* 455* 460*    COAGS:  Recent Labs  12/20/14 0225  INR 1.15  APTT 30    BMP:  Recent Labs  12/14/14 0614 12/18/14 0308 12/20/14 0225 12/22/14 0454  NA 134* 134* 134* 134*  K 3.7 3.8 4.1 4.5  CL 99* 102 98* 100*  CO2 28 22 27 27   GLUCOSE 109* 117* 99 94  BUN 5* 9 7 9   CALCIUM 8.8* 8.9 8.9 9.2  CREATININE 0.80 0.73 0.74 0.76  GFRNONAA >60 >60 >60 >60  GFRAA >60 >60 >60 >60    LIVER FUNCTION TESTS:  Recent Labs  12/13/14 0024 12/22/14 0454  BILITOT 0.9 0.3  AST 23 51*  ALT 40  125*  ALKPHOS 59 80  PROT 7.8 7.3  ALBUMIN 4.6 3.5    Assessment and Plan: Perforated appendicitis  S/p appendectomy, LOA 12/13/14 by CCS, (2) prior [place]  CT guided aspiration of RUQ abdominal fluid collection 12/20/14- see previous IR consult, Cx E. Coli Elevated leukocytosis with persistent RUQ pain CT today revealed RUQ fluid collection increased in size concern for abscess Request for image guided drain placement The patient will be NPO after midnight, hold lovenox, labs and vitals have been reviewed. Risks and Benefits discussed with the patient using spanish interpreter including bleeding, infection, damage to adjacent structures, bowel perforation/fistula connection, and sepsis. All of the patient's questions were answered, patient is agreeable to proceed. Consent signed and in chart.   SignedBerneta Levins: Raeleen Winstanley D 12/25/2014, 4:45 PM   I spent a total of 15 Minutes at the the patient's bedside AND on the patient's hospital floor or unit, greater than 50% of which was counseling/coordinating care for abdominal fluid collection.

## 2014-12-25 NOTE — Progress Notes (Signed)
CT results:  Fluid and air collection noted along inferior margin of right hepatic lobe is significantly larger currently, now measuring 8.2 x 4.0 cm. Calculus remains within it concerning for appendicolith. Surgical drain remains in the right lower quadrant of the abdomen with very small fluid collection remaining, which is significantly smaller compared to prior exam. Fluid collection seen in the pelvis on prior exam is also significantly smaller, measuring 3.1 x 1.2 cm. There is no evidence of bowel obstruction.   Study review, by Dr. Derrell Lollingamirez, and we plan to switch him back to Invanz,and ask IR to drain site.  I will make him NPO after MN.  Dr. Derrell Lollingamirez will review study and plan with patient.

## 2014-12-25 NOTE — Progress Notes (Signed)
Interpreter Wyvonnia DuskyGraciela Namihira for Maretta LosKareen Morgan PA-C Radiology Services

## 2014-12-25 NOTE — Progress Notes (Signed)
12 Days Post-Op  Subjective: He is having some pain today over the drain that was not there yesterday.  Otherwise no real change he is aware of.   Objective: Vital signs in last 24 hours: Temp:  [98.5 F (36.9 C)-100.2 F (37.9 C)] 98.9 F (37.2 C) (11/29 0619) Pulse Rate:  [86-103] 103 (11/29 0619) Resp:  [18] 18 (11/29 0619) BP: (111-120)/(62-71) 111/62 mmHg (11/29 0619) SpO2:  [97 %-98 %] 97 % (11/29 0619) Last BM Date: 12/22/14 Nothing from the drain 100.2 TM last PM WBC 18.1 this AM Aspiration 11/24, CT 12/19/14 Intake/Output from previous day: 11/28 0701 - 11/29 0700 In: 779.2 [P.O.:220; I.V.:559.2] Out: 0  Intake/Output this shift:    General appearance: alert, cooperative and no distress GI: soft, tender over drain site again.  + BS. tolerating regular diet  Lab Results:   Recent Labs  12/24/14 1044 12/25/14 0638  WBC 15.2* 18.1*  HGB 14.5 14.2  HCT 42.3 41.9  PLT 455* 460*    BMET No results for input(s): NA, K, CL, CO2, GLUCOSE, BUN, CREATININE, CALCIUM in the last 72 hours. PT/INR No results for input(s): LABPROT, INR in the last 72 hours.   Recent Labs Lab 12/22/14 0454  AST 51*  ALT 125*  ALKPHOS 80  BILITOT 0.3  PROT 7.3  ALBUMIN 3.5     Lipase     Component Value Date/Time   LIPASE 25 12/13/2014 0024     Studies/Results: No results found.  Medications: . cefpodoxime  400 mg Oral Q12H  . enoxaparin (LOVENOX) injection  40 mg Subcutaneous Q24H  . saccharomyces boulardii  250 mg Oral BID    Assessment/Plan Perforated appendicitis S/p APPENDECTOMY LAPAROSCOPIC FOR RUPTURED APPENDIX, LAPAROSCOPIC LYSIS OF ADHESIONS 45 MINUTES; 12/13/14, Dr. Violeta GelinasBurke Thompson POD 11 2 prior surgeries in [place] for [place] Post op sepsis/peritonitis CT guided aspiration of right upper abdominal abscess adjacent to fecolith/appendicolith. 30cc of foul-smelling dark fluid aspirated and sent for culture.12/20/14, Dr. Ruthy DickJamie Wagner E coli resistant  to Augmentin and Zosyn from this aspiration Post op ileus  Antibiotics: Day 8 Zosyn , Augmentin 12/18/14 back to Zosyn 12/19/14  (day 12 antibiotics today) DVT: Lovenox/SCD    Plan:  CT scan today.     LOS: 12 days    Dale Crane 12/25/2014

## 2014-12-26 ENCOUNTER — Inpatient Hospital Stay (HOSPITAL_COMMUNITY): Payer: Self-pay

## 2014-12-26 MED ORDER — FENTANYL CITRATE (PF) 100 MCG/2ML IJ SOLN
50.0000 ug | Freq: Once | INTRAMUSCULAR | Status: AC
  Administered 2014-12-26: 50 ug via INTRAVENOUS

## 2014-12-26 MED ORDER — MIDAZOLAM HCL 2 MG/2ML IJ SOLN
INTRAMUSCULAR | Status: AC
Start: 2014-12-26 — End: 2014-12-27
  Filled 2014-12-26: qty 4

## 2014-12-26 MED ORDER — FENTANYL CITRATE (PF) 100 MCG/2ML IJ SOLN
INTRAMUSCULAR | Status: AC
  Administered 2014-12-26: 50 ug via INTRAVENOUS
  Filled 2014-12-26: qty 4

## 2014-12-26 MED ORDER — SODIUM CHLORIDE 0.9 % IV SOLN
INTRAVENOUS | Status: AC | PRN
  Administered 2014-12-26: 10 mL/h via INTRAVENOUS

## 2014-12-26 MED ORDER — FENTANYL CITRATE (PF) 100 MCG/2ML IJ SOLN
INTRAMUSCULAR | Status: AC | PRN
  Administered 2014-12-26: 50 ug via INTRAVENOUS
  Administered 2014-12-26: 25 ug via INTRAVENOUS
  Administered 2014-12-26: 50 ug via INTRAVENOUS

## 2014-12-26 MED ORDER — LIDOCAINE HCL 1 % IJ SOLN
INTRAMUSCULAR | Status: AC
  Filled 2014-12-26: qty 20

## 2014-12-26 MED ORDER — MIDAZOLAM HCL 2 MG/2ML IJ SOLN
INTRAMUSCULAR | Status: AC | PRN
  Administered 2014-12-26 (×2): 1 mg via INTRAVENOUS

## 2014-12-26 NOTE — Sedation Documentation (Signed)
Patient is resting comfortably. 

## 2014-12-26 NOTE — Sedation Documentation (Signed)
Pt reports some pain, additional medication given per Dr. Bonnielee HaffHoss

## 2014-12-26 NOTE — Progress Notes (Signed)
Pt reports increase pain with movement and touch to RLQ. Verbal order from Dr. Bonnielee HaffHoss for Fentanyl 50 mcg one time dose, verified and read back.  Victorino DikeLeslie Evian Salguero RN

## 2014-12-26 NOTE — Progress Notes (Signed)
Patient ID: Dale Crane, male   DOB: December 31, 1985, 29 y.o.   MRN: [id number]     [facility]      [address] [place]   [place], [place] [address]    Phone: [phone] FAX: [phone]     Subjective: Spoke via pacific interpreter. No complaints.  No n/v.  Having BMs.  Afebrile. Reviewed CT with the patient. NPO for procedure today.  10cc from drain.  Objective:  Vital signs:  Filed Vitals:   12/25/14 0619 12/25/14 1442 12/25/14 2238 12/26/14 0626  BP: 111/62 121/50 114/62 115/60  Pulse: 103 100 110 102  Temp: 98.9 F (37.2 C) 99.8 F (37.7 C) 100 F (37.8 C) 98.5 F (36.9 C)  TempSrc: Oral Oral Oral Oral  Resp: Height:      Weight:      SpO2: 97% 97% 96% 95%    Last BM Date: 12/24/14  Intake/Output   Yesterday:  11/29 0701 - 11/30 0700 In: 1286 [P.O.:720; I.V.:566] Out: 10 [Drains:10] This shift:    I/O last 3 completed shifts: In: 1506 [P.O.:940; I.V.:566] Out: 10 [Drains:10]    Physical Exam: General: Pt awake/alert/oriented x4 in no acute distress Chest: cta.  No chest wall pain w good excursion CV:  Pulses intact.  Regular rhythm MS: Normal AROM mjr joints.  No obvious deformity Abdomen: Soft.  Nondistended.  ttp rlq.  Incisions are c/d/i.  jp drain with serous output.   No evidence of peritonitis.  No incarcerated hernias. Ext:  SCDs BLE.  No mjr edema.  No cyanosis Skin: No petechiae / purpura   Problem List:   Principal Problem:   Perforated appendicitis s/p lap appendectomy 12/13/2014 Active Problems:   Ileus, postoperative   Intraabdominal fluid collection    Results:   Labs: Results for orders placed or performed during the hospital encounter of 12/13/14 (from the past 48 hour(s))  CBC     Status: Abnormal   Collection Time: 12/24/14 10:44 AM  Result Value Ref Range   WBC 15.2 (H) 4.0 - 10.5 K/uL   RBC 5.27 4.22 - 5.81 MIL/uL   Hemoglobin 14.5 13.0 - 17.0 g/dL   HCT  95.2 84.1 - 32.4 %   MCV 80.3 78.0 - 100.0 fL   MCH 27.5 26.0 - 34.0 pg   MCHC 34.3 30.0 - 36.0 g/dL   RDW 40.1 02.7 - 25.3 %   Platelets 455 (H) 150 - 400 K/uL  CBC     Status: Abnormal   Collection Time: 12/25/14  6:38 AM  Result Value Ref Range   WBC 18.1 (H) 4.0 - 10.5 K/uL   RBC 5.25 4.22 - 5.81 MIL/uL   Hemoglobin 14.2 13.0 - 17.0 g/dL   HCT 66.4 40.3 - 47.4 %   MCV 79.8 78.0 - 100.0 fL   MCH 27.0 26.0 - 34.0 pg   MCHC 33.9 30.0 - 36.0 g/dL   RDW 25.9 56.3 - 87.5 %   Platelets 460 (H) 150 - 400 K/uL    Imaging / Studies: Ct Abdomen Pelvis W Contrast  12/25/2014  CLINICAL DATA:  Ruptured appendix. EXAM: CT ABDOMEN AND PELVIS WITH CONTRAST TECHNIQUE: Multidetector CT imaging of the abdomen and pelvis was performed using the standard protocol following bolus administration of intravenous contrast. CONTRAST:  OMNIPAQUE IOHEXOL 300 MG/ML  SOLN COMPARISON:  CT scan of December 19, 2014. FINDINGS: Mild bilateral posterior basilar subsegmental atelectasis is noted. No significant osseous abnormality is  noted. No gallstones are noted. The spleen and pancreas appear normal. Fluid and air collection noted along inferior margin of right hepatic lobe is significantly larger currently, now measuring 8.2 x 4.0 cm. Calculus remains within it concerning for appendicolith. Adrenal glands and kidneys appear normal. No hydronephrosis or renal obstruction is noted. Surgical drain remains in the right lower quadrant of the abdomen with very small fluid collection remaining, which is significantly smaller compared to prior exam. Fluid collection seen in the pelvis on prior exam is also significantly smaller, measuring 3.1 x 1.2 cm. There is no evidence of bowel obstruction. Urinary bladder appears normal. No significant adenopathy is noted. IMPRESSION: Fluid collections seen in the right lower quadrant of the abdomen as well as in the pelvis are significantly smaller compared to prior exam. However, air  and fluid collection seen inferior to right hepatic lobe on prior exam is significantly larger currently, concerning for abscess. These results will be called to the ordering clinician or representative by the Radiologist Assistant, and communication documented in the PACS or zVision Dashboard. Electronically Signed   By: Lupita RaiderJames  Green Jr, M.D.   On: 12/25/2014 15:32    Medications / Allergies:  Scheduled Meds: . [START ON 12/27/2014] enoxaparin (LOVENOX) injection  40 mg Subcutaneous Q24H  . ertapenem  1 g Intravenous Q24H  . saccharomyces boulardii  250 mg Oral BID   Continuous Infusions: . 0.9 % NaCl with KCl 20 mEq / L 50 mL/hr at 12/25/14 1211  . lactated ringers     PRN Meds:.acetaminophen, diphenhydrAMINE **OR** diphenhydrAMINE, morphine injection, ondansetron **OR** ondansetron (ZOFRAN) IV, oxyCODONE-acetaminophen  Antibiotics: Anti-infectives    Start     Dose/Rate Route Frequency Ordered Stop   12/25/14 1630  ertapenem (INVANZ) 1 g in sodium chloride 0.9 % 50 mL IVPB     1 g 100 mL/hr over 30 Minutes Intravenous Every 24 hours 12/25/14 1556     12/24/14 1100  cefpodoxime (VANTIN) tablet 400 mg  Status:  Discontinued     400 mg Oral Every 12 hours 12/24/14 1001 12/25/14 1556   12/21/14 1000  amoxicillin-clavulanate (AUGMENTIN) 875-125 MG per tablet 1 tablet  Status:  Discontinued     1 tablet Oral Every 12 hours 12/21/14 0913 12/24/14 1001   12/19/14 1400  piperacillin-tazobactam (ZOSYN) IVPB 3.375 g  Status:  Discontinued     3.375 g 12.5 mL/hr over 240 Minutes Intravenous 3 times per day 12/19/14 1343 12/21/14 0913   12/18/14 1045  amoxicillin-clavulanate (AUGMENTIN) 875-125 MG per tablet 1 tablet  Status:  Discontinued     1 tablet Oral Every 12 hours 12/18/14 1031 12/19/14 1302   12/13/14 1000  piperacillin-tazobactam (ZOSYN) IVPB 3.375 g  Status:  Discontinued     3.375 g 12.5 mL/hr over 240 Minutes Intravenous 3 times per day 12/13/14 0345 12/18/14 1031   12/13/14 0600   piperacillin-tazobactam (ZOSYN) IVPB 3.375 g  Status:  Discontinued     3.375 g 12.5 mL/hr over 240 Minutes Intravenous 3 times per day 12/13/14 0332 12/13/14 0345   12/13/14 0245  piperacillin-tazobactam (ZOSYN) IVPB 3.375 g     3.375 g 100 mL/hr over 30 Minutes Intravenous  Once 12/13/14 0230 12/13/14 [id number]        Assessment/Plan 2 previous appendectomies [place].  Most recent CT suggests an appendicolith  Ruptured appendix POD#13 laparoscopic appendectomy with LOA---Dr. Janee Mornhompson 12/13/14 Intra-abdominal fluid collections -IR today for drain placement -continue surgical drain -shower, mobilize, IS ID-zosyn D#9, Invanz 11/29--->   FEN-NPO  for procedure, resume diet VTE prophylaxis-SCD/lovenox Dispo-inpatient  Ashok Norris, [facility] Surgery Pager [phone]) For consults and floor pages call [phone](7A-4:30P)  12/26/2014 8:25 AM

## 2014-12-26 NOTE — Procedures (Signed)
R hepatic abscess 10 fr Pus No comp/EBL

## 2014-12-27 LAB — CBC
HCT: 39.6 % (ref 39.0–52.0)
Hemoglobin: 13 g/dL (ref 13.0–17.0)
MCH: 26.5 pg (ref 26.0–34.0)
MCHC: 32.8 g/dL (ref 30.0–36.0)
MCV: 80.7 fL (ref 78.0–100.0)
PLATELETS: 456 10*3/uL — AB (ref 150–400)
RBC: 4.91 MIL/uL (ref 4.22–5.81)
RDW: 12.6 % (ref 11.5–15.5)
WBC: 10.2 10*3/uL (ref 4.0–10.5)

## 2014-12-27 LAB — BASIC METABOLIC PANEL
ANION GAP: 8 (ref 5–15)
BUN: 6 mg/dL (ref 6–20)
CO2: 30 mmol/L (ref 22–32)
Calcium: 9.1 mg/dL (ref 8.9–10.3)
Chloride: 96 mmol/L — ABNORMAL LOW (ref 101–111)
Creatinine, Ser: 0.79 mg/dL (ref 0.61–1.24)
GFR calc Af Amer: >60 mL/min (ref >60)
GFR calc non Af Amer: >60 mL/min (ref >60)
GLUCOSE: 103 mg/dL — AB (ref 65–99)
POTASSIUM: 5 mmol/L (ref 3.5–5.1)
SODIUM: 134 mmol/L — AB (ref 135–145)

## 2014-12-27 NOTE — Progress Notes (Signed)
Patient ID: Dale Crane, male   DOB: 1985-08-09, 29 y.o.   MRN: [id number]     [facility]      [address] [place], [place] [address]    Phone: [phone] FAX: [phone]     Subjective: IR drain---187m out Surgical drain---873m No n/v.  Pain  Mostly around IR drain. VSS.  Afebrile. Normal white count.   Objective:  Vital signs:  Filed Vitals:   12/26/14 1327 12/26/14 2109 12/27/14 0121 12/27/14 0518  BP: 127/78 107/70 108/71 102/45  Pulse: 101 83 79 82  Temp:  98.4 F (36.9 C) 97.5 F (36.4 C) 98.2 F (36.8 C)  TempSrc:  Oral Oral Oral  Resp: _0 Height:      Weight:      SpO2: 97% 98% 96% 96%    Last BM Date: 12/24/14  Intake/Output   Yesterday:  11/30 0701 - 12/01 0700 In: 2651.7 [P.O.:920; I.V.:1731.7] Out: 973 [Urine:850; Drains:123] This shift:    I/O last 3 completed shifts: In: 2651.7 [P.O.:920; I.V.:1731.7] Out: 983 [Urine:850; Drains:133]    Physical Exam: General: Pt awake/alert/oriented x4 in no acute distress Chest: cta. No chest wall pain w good excursion CV: Pulses intact. Regular rhythm MS: Normal AROM mjr joints. No obvious deformity Abdomen: Soft. Nondistended. ttp rlq. Incisions are c/d/i. jp drain with serous output.lateral drain with purulent drainage.   No evidence of peritonitis. No incarcerated hernias. Ext: SCDs BLE. No mjr edema. No cyanosis Skin: No petechiae / purpura   Problem List:   Principal Problem:   Perforated appendicitis s/p lap appendectomy 12/13/2014 Active Problems:   Ileus, postoperative   Intraabdominal fluid collection    Results:   Labs: Results for orders placed or performed during the hospital encounter of 12/13/14 (from the past 48 hour(s))  Culture, routine-abscess     Status: None (Preliminary result)   Collection Time: 12/26/14  4:00 PM  Result Value Ref Range   Specimen Description ABSCESS LIVER    Special  Requests NONE    Gram Stain PENDING    Culture NO GROWTH Performed at [facility]   Report Status PENDING   CBC     Status: Abnormal   Collection Time: 12/27/14  7:00 AM  Result Value Ref Range   WBC 10.2 4.0 - 10.5 K/uL   RBC 4.91 4.22 - 5.81 MIL/uL   Hemoglobin 13.0 13.0 - 17.0 g/dL   HCT 39.6 39.0 - 52.0 %   MCV 80.7 78.0 - 100.0 fL   MCH 26.5 26.0 - 34.0 pg   MCHC 32.8 30.0 - 36.0 g/dL   RDW 12.6 11.5 - 15.5 %   Platelets 456 (H) 150 - 400 K/uL  Basic metabolic panel     Status: Abnormal   Collection Time: 12/27/14  7:00 AM  Result Value Ref Range   Sodium 134 (L) 135 - 145 mmol/L   Potassium 5.0 3.5 - 5.1 mmol/L   Chloride 96 (L) 101 - 111 mmol/L   CO2 30 22 - 32 mmol/L   Glucose, Bld 103 (H) 65 - 99 mg/dL   BUN 6 6 - 20 mg/dL   Creatinine, Ser 0.79 0.61 - 1.24 mg/dL   Calcium 9.1 8.9 - 10.3 mg/dL   GFR calc non Af Amer >60 >60 mL/min   GFR calc Af Amer >60 >60 mL/min    Comment: (NOTE) The eGFR has been calculated using the CKD EPI equation. This  calculation has not been validated in all clinical situations. eGFR's persistently <60 mL/min signify possible Chronic Kidney Disease.    Anion gap 8 5 - 15    Imaging / Studies: Ct Abdomen Pelvis W Contrast  12/25/2014  CLINICAL DATA:  Ruptured appendix. EXAM: CT ABDOMEN AND PELVIS WITH CONTRAST TECHNIQUE: Multidetector CT imaging of the abdomen and pelvis was performed using the standard protocol following bolus administration of intravenous contrast. CONTRAST:  128m OMNIPAQUE IOHEXOL 300 MG/ML  SOLN COMPARISON:  CT scan of December 19, 2014. FINDINGS: Mild bilateral posterior basilar subsegmental atelectasis is noted. No significant osseous abnormality is noted. No gallstones are noted. The spleen and pancreas appear normal. Fluid and air collection noted along inferior margin of right hepatic lobe is significantly larger currently, now measuring 8.2 x 4.0 cm. Calculus remains within it concerning for  appendicolith. Adrenal glands and kidneys appear normal. No hydronephrosis or renal obstruction is noted. Surgical drain remains in the right lower quadrant of the abdomen with very small fluid collection remaining, which is significantly smaller compared to prior exam. Fluid collection seen in the pelvis on prior exam is also significantly smaller, measuring 3.1 x 1.2 cm. There is no evidence of bowel obstruction. Urinary bladder appears normal. No significant adenopathy is noted. IMPRESSION: Fluid collections seen in the right lower quadrant of the abdomen as well as in the pelvis are significantly smaller compared to prior exam. However, air and fluid collection seen inferior to right hepatic lobe on prior exam is significantly larger currently, concerning for abscess. These results will be called to the ordering clinician or representative by the Radiologist Assistant, and communication documented in the PACS or zVision Dashboard. Electronically Signed   By: JMarijo Conception M.D.   On: 12/25/2014 15:32   Ct Image Guided Fluid Drain By Catheter  12/26/2014  CLINICAL DATA:  Hepatic abscess EXAM: CT IMAGE GUIDED FLUID DRAIN BY CATHETER FLUOROSCOPY TIME:  None MEDICATIONS AND MEDICAL HISTORY: Versed 2 mg, Fentanyl 125 mcg. Additional Medications: None. ANESTHESIA/SEDATION: Moderate sedation time: 20 minutes CONTRAST:  None PROCEDURE: The procedure, risks, benefits, and alternatives were explained to the patient. Questions regarding the procedure were encouraged and answered. The patient understands and consents to the procedure. The right flank was prepped with ChloraPrep in a sterile fashion, and a sterile drape was applied covering the operative field. A sterile gown and sterile gloves were used for the procedure. Under CT guidance, an 18 gauge needle was inserted into the hepatic abscess and removed over an Amplatz wire. A 10 French dilator followed by a 10 FPakistandrain were inserted. It was looped and string  fixed in the abscess. Pus was aspirated. FINDINGS: Images demonstrate 164French abscess drain placement into a lower hepatic abscess. The abscess contains a large calcification. COMPLICATIONS: None IMPRESSION: Successful right hepatic abscess drain. Electronically Signed   By: AMarybelle KillingsM.D.   On: 12/26/2014 15:56    Medications / Allergies:  Scheduled Meds: . enoxaparin (LOVENOX) injection  40 mg Subcutaneous Q24H  . ertapenem  1 g Intravenous Q24H  . saccharomyces boulardii  250 mg Oral BID   Continuous Infusions: . 0.9 % NaCl with KCl 20 mEq / L 50 mL/hr at 12/27/14 0508  . lactated ringers     PRN Meds:.acetaminophen, diphenhydrAMINE **OR** diphenhydrAMINE, morphine injection, ondansetron **OR** ondansetron (ZOFRAN) IV, oxyCODONE-acetaminophen  Antibiotics: Anti-infectives    Start     Dose/Rate Route Frequency Ordered Stop   12/25/14 1630  ertapenem (INVANZ) 1 g in  sodium chloride 0.9 % 50 mL IVPB     1 g 100 mL/hr over 30 Minutes Intravenous Every 24 hours 12/25/14 1556     12/24/14 1100  cefpodoxime (VANTIN) tablet 400 mg  Status:  Discontinued     400 mg Oral Every 12 hours 12/24/14 1001 12/25/14 1556   12/21/14 1000  amoxicillin-clavulanate (AUGMENTIN) 875-125 MG per tablet 1 tablet  Status:  Discontinued     1 tablet Oral Every 12 hours 12/21/14 0913 12/24/14 1001   12/19/14 1400  piperacillin-tazobactam (ZOSYN) IVPB 3.375 g  Status:  Discontinued     3.375 g 12.5 mL/hr over 240 Minutes Intravenous 3 times per day 12/19/14 1343 12/21/14 0913   12/18/14 1045  amoxicillin-clavulanate (AUGMENTIN) 875-125 MG per tablet 1 tablet  Status:  Discontinued     1 tablet Oral Every 12 hours 12/18/14 1031 12/19/14 1302   12/13/14 1000  piperacillin-tazobactam (ZOSYN) IVPB 3.375 g  Status:  Discontinued     3.375 g 12.5 mL/hr over 240 Minutes Intravenous 3 times per day 12/13/14 0345 12/18/14 1031   12/13/14 0600  piperacillin-tazobactam (ZOSYN) IVPB 3.375 g  Status:  Discontinued      3.375 g 12.5 mL/hr over 240 Minutes Intravenous 3 times per day 12/13/14 0332 12/13/14 0345   12/13/14 0245  piperacillin-tazobactam (ZOSYN) IVPB 3.375 g     3.375 g 100 mL/hr over 30 Minutes Intravenous  Once 12/13/14 0230 12/13/14 1415        Assessment/Plan 2 previous appendectomies in [place]. Most recent CT suggests an appendicolith  Ruptured appendix POD#14 laparoscopic appendectomy with LOA---Dr. Grandville Silos 12/13/14 Intra-abdominal fluid collections -s/p IR drain placement 11/30 -DC surgical drain -shower, mobilize, IS ID-zosyn D#9, Invanz 11/29---> change to cefpoxomine at discharge for additional 2 weeks. Doubt he can afford this, will consult to CM.  Cx 11/24 e coli.  Cx 11/30 NGTD.   sensitive to cephalosporins  FEN-no issues VTE prophylaxis-SCD/lovenox Dispo-DC later today.    Erby Pian, [facility] Pager [phone]) For consults and floor pages call [phone])  12/27/2014

## 2014-12-27 NOTE — Progress Notes (Signed)
Subjective: Pt not feeling much better. S/p perc drain to RUQ abscess yesterday   Allergies: Review of patient's allergies indicates no known allergies.  Medications:  Current facility-administered medications:  .  0.9 % NaCl with KCl 20 mEq/ L  infusion, , Intravenous, Continuous, Sherrie George, PA-C, Last Rate: 50 mL/hr at 12/27/14 1610 .  acetaminophen (TYLENOL) tablet 650 mg, 650 mg, Oral, Q6H PRN, Harriette Bouillon, MD, 650 mg at 12/16/14 2146 .  diphenhydrAMINE (BENADRYL) capsule 25 mg, 25 mg, Oral, Q6H PRN **OR** diphenhydrAMINE (BENADRYL) injection 25 mg, 25 mg, Intravenous, Q6H PRN, Abigail Miyamoto, MD .  enoxaparin (LOVENOX) injection 40 mg, 40 mg, Subcutaneous, Q24H, Koreen D Morgan, PA-C, 40 mg at 12/27/14 0507 .  ertapenem (INVANZ) 1 g in sodium chloride 0.9 % 50 mL IVPB, 1 g, Intravenous, Q24H, Sherrie George, PA-C, 1 g at 12/26/14 1614 .  lactated ringers infusion, , Intravenous, Continuous, Achille Rich, MD .  morphine 2 MG/ML injection 1-4 mg, 1-4 mg, Intravenous, Q1H PRN, Abigail Miyamoto, MD, 4 mg at 12/20/14 0538 .  ondansetron (ZOFRAN-ODT) disintegrating tablet 4 mg, 4 mg, Oral, Q6H PRN **OR** ondansetron (ZOFRAN) injection 4 mg, 4 mg, Intravenous, Q6H PRN, Abigail Miyamoto, MD, 4 mg at 12/13/14 1345 .  oxyCODONE-acetaminophen (PERCOCET/ROXICET) 5-325 MG per tablet 1-2 tablet, 1-2 tablet, Oral, Q4H PRN, Ashok Norris, NP, 2 tablet at 12/26/14 2137 .  saccharomyces boulardii (FLORASTOR) capsule 250 mg, 250 mg, Oral, BID, Sherrie George, PA-C, 250 mg at 12/27/14 0941    Vital Signs: BP 102/45 mmHg  Pulse 82  Temp(Src) 98.2 F (36.8 C) (Oral)  Resp 17  Ht  (1.727 m)  Wt 215 lb 9.8 oz (97.8 kg)  BMI 32.79 kg/m2  SpO2 96%  Physical Exam Abd: soft, tender right side, sensitive at IR perc drain site Drain intact, purulent output Surgical drain with thin green output   Imaging: Ct Abdomen Pelvis W Contrast  12/25/2014  CLINICAL DATA:  Ruptured  appendix. EXAM: CT ABDOMEN AND PELVIS WITH CONTRAST TECHNIQUE: Multidetector CT imaging of the abdomen and pelvis was performed using the standard protocol following bolus administration of intravenous contrast. CONTRAST:  OMNIPAQUE IOHEXOL 300 MG/ML  SOLN COMPARISON:  CT scan of December 19, 2014. FINDINGS: Mild bilateral posterior basilar subsegmental atelectasis is noted. No significant osseous abnormality is noted. No gallstones are noted. The spleen and pancreas appear normal. Fluid and air collection noted along inferior margin of right hepatic lobe is significantly larger currently, now measuring 8.2 x 4.0 cm. Calculus remains within it concerning for appendicolith. Adrenal glands and kidneys appear normal. No hydronephrosis or renal obstruction is noted. Surgical drain remains in the right lower quadrant of the abdomen with very small fluid collection remaining, which is significantly smaller compared to prior exam. Fluid collection seen in the pelvis on prior exam is also significantly smaller, measuring 3.1 x 1.2 cm. There is no evidence of bowel obstruction. Urinary bladder appears normal. No significant adenopathy is noted. IMPRESSION: Fluid collections seen in the right lower quadrant of the abdomen as well as in the pelvis are significantly smaller compared to prior exam. However, air and fluid collection seen inferior to right hepatic lobe on prior exam is significantly larger currently, concerning for abscess. These results will be called to the ordering clinician or representative by the Radiologist Assistant, and communication documented in the PACS or zVision Dashboard. Electronically Signed   By: Lupita Raider, M.D.   On: 12/25/2014 15:32   Ct Image Guided  Fluid Drain By Catheter  12/26/2014  CLINICAL DATA:  Hepatic abscess EXAM: CT IMAGE GUIDED FLUID DRAIN BY CATHETER FLUOROSCOPY TIME:  None MEDICATIONS AND MEDICAL HISTORY: Versed 2 mg, Fentanyl 125 mcg. Additional Medications: None.  ANESTHESIA/SEDATION: Moderate sedation time: 20 minutes CONTRAST:  None PROCEDURE: The procedure, risks, benefits, and alternatives were explained to the patient. Questions regarding the procedure were encouraged and answered. The patient understands and consents to the procedure. The right flank was prepped with ChloraPrep in a sterile fashion, and a sterile drape was applied covering the operative field. A sterile gown and sterile gloves were used for the procedure. Under CT guidance, an 18 gauge needle was inserted into the hepatic abscess and removed over an Amplatz wire. A 10 French dilator followed by a 10 [place] drain were inserted. It was looped and string fixed in the abscess. Pus was aspirated. FINDINGS: Images demonstrate 4210 French abscess drain placement into a lower hepatic abscess. The abscess contains a large calcification. COMPLICATIONS: None IMPRESSION: Successful right hepatic abscess drain. Electronically Signed   By: Jolaine ClickArthur  Hoss M.D.   On: 12/26/2014 15:56    Labs:  CBC:  Recent Labs  12/23/14 0456 12/24/14 1044 12/25/14 0638 12/27/14 0700  WBC 13.8* 15.2* 18.1* 10.2  HGB 13.2 14.5 14.2 13.0  HCT 39.5 42.3 41.9 39.6  PLT 437* 455* 460* 456*    COAGS:  Recent Labs  12/20/14 0225  INR 1.15  APTT 30    BMP:  Recent Labs  12/18/14 0308 12/20/14 0225 12/22/14 0454 12/27/14 0700  NA 134* 134* 134* 134*  K 3.8 4.1 4.5 5.0  CL 102 98* 100* 96*  CO2 22 27 27 30   GLUCOSE 117* 99 94 103*  BUN 9 7 9 6   CALCIUM 8.9 8.9 9.2 9.1  CREATININE 0.73 0.74 0.76 0.79  GFRNONAA >60 >60 >60 >60  GFRAA >60 >60 >60 >60    LIVER FUNCTION TESTS:  Recent Labs  12/13/14 0024 12/22/14 0454  BILITOT 0.9 0.3  AST 23 51*  ALT 40 125*  ALKPHOS 59 80  PROT 7.8 7.3  ALBUMIN 4.6 3.5    Assessment and Plan: S/p lap appy with LOA Post op abscess S/p perc drain RUQ, purulent output, Cx pending  Signed: Brayton ElBRUNING, Desjuan Stearns 12/27/2014, 9:57 AM   I spent a total of 15  Minutes at the the patient's bedside AND on the patient's hospital floor or unit, greater than 50% of which was counseling/coordinating care for percutaneous drainage of intra-abdominal abscess

## 2014-12-28 ENCOUNTER — Other Ambulatory Visit: Payer: Self-pay | Admitting: Radiology

## 2014-12-28 DIAGNOSIS — IMO0001 Reserved for inherently not codable concepts without codable children: Secondary | ICD-10-CM

## 2014-12-28 DIAGNOSIS — K651 Peritoneal abscess: Principal | ICD-10-CM

## 2014-12-28 DIAGNOSIS — T814XXD Infection following a procedure, subsequent encounter: Principal | ICD-10-CM

## 2014-12-28 MED ORDER — SACCHAROMYCES BOULARDII 250 MG PO CAPS: 250.0000 mg | ORAL_CAPSULE | Freq: Two times a day (BID) | ORAL | Status: DC

## 2014-12-28 MED ORDER — CEFPODOXIME PROXETIL 200 MG PO TABS: 400.0000 mg | ORAL_TABLET | Freq: Two times a day (BID) | ORAL | Status: DC

## 2014-12-28 MED ORDER — OXYCODONE-ACETAMINOPHEN 5-325 MG PO TABS: 1.0000 | ORAL_TABLET | ORAL | Status: DC | PRN

## 2014-12-28 NOTE — Progress Notes (Signed)
Spanish AVS given to patient and wife with Ashby DawesGraciela, Armed forces technical officernterpreter and RN.  Understanding demonstrated verbally to interpreter and RN on medications/prescriptions, showering, changing dressings with split gauze after showering, emptying drain 1 & 2, recharging suction on drains, flushing drain 2, cleaning flush site on tubing with alcohol.  Patient instructed by interpreter and RN to go to Dr immediately if temperature greater than 100.4, new redness and swelling at drain sites.  Patient advised by interpreter and RN to complete all antibiotics till gone.  Hand written notes in Spanish on all topics listed above given to patient by Interpreter and RN.  IVs removed.  Gauze, tape, flush syringes, cups, and alcohol wipes given to patient for daily drain care at home.  MD and HH spoke to patient with interpreter prior to leaving.  Patient walked out with family.

## 2014-12-28 NOTE — Discharge Summary (Signed)
Physician Discharge Summary  Dale Crane YNW:[id number] DOB: 1985/02/04 DOA: 12/13/2014  PCP: No primary care provider on file.  Consultation: IR  Admit date: 12/13/2014 Discharge date: 12/28/2014  Recommendations for Outpatient Follow-up:   Follow-up Information    Follow up with Liz Malady, MD On 01/09/2015.   Specialty:  General Surgery   Why:  arrive by 10:15AM for a 10:50AM post op check   Contact information:   [address] [place] [address] [phone]       Schedule an appointment as soon as possible for a visit with HOSS, ART A, MD.   Specialty:  Interventional Radiology   Contact information:   [address] [place] [address] [phone]      Discharge Diagnoses:  1. Ruptured appendix 2. Intra-abdominal abscesses   Surgical Procedure: laparoscopic appendectomy with LOA---Dr. Janee Morn   Discharge Condition: stable Disposition: home  Diet recommendation: regular  Filed Weights   12/12/14 2356 12/13/14 0334  Weight: 113.853 kg (251 lb) 97.8 kg (215 lb 9.8 oz)       Hospital Course:  Dale Crane is a 29 year old male who presented to [facility] with abdominal pain.  He reported having an appendectomy x2 in [place] about 3 years ago.  CT showed appendicitis.  He underwent the procedure listed above.  Found to have a perforation.  He was admitted for IV antibiotics.  Repeat CT Scan showed an intraabdominal abscess.  IR was consulted and he subsequently had a CT guided aspiration on 11/24.  He was found to have a fecolith/appendicolith during this procedure.  This culture showed E coli, resistant to several PO antibiotics.  He was kept on Zosyn and subsequently changed to Invanz on 11/29 after another CT scan showed a fluid collection along the right hepatic lobe and once again noted appendicolith.  IR was able to place a drain to this abscess.  Despite surgical drain not having much output, the output appeared  bilious, not feculent, the drain was therefore kept in place.  Leukocytosis and fevers resolved.  The patient remained stable.   On POD#14 the patient was felt stable for discharge home.  I thoroughly discussed his plan and instructions with Dale Crane.  I spoke with pharmacy regarding appropriate antibiotics who recommended cefpodoxime after reviewing first and second cultures.  He was matched to ensure he completes his course. I scheduled his follow up with Dr. Janee Morn.  Interventional Radiology will contact the patient to schedule a follow up prior to this visit for a repeat CT Scan and drain injection.  I discussed with the patient the warning signs that warrant urgent evaluation.  He verbalizes understanding.    Discharge Instructions     Medication List    TAKE these medications        cefpodoxime 200 MG tablet  Commonly known as:  VANTIN  Take 2 tablets (400 mg total) by mouth 2 (two) times daily.     oxyCODONE-acetaminophen 5-325 MG tablet  Commonly known as:  PERCOCET/ROXICET  Take 1-2 tablets by mouth every 4 (four) hours as needed for moderate pain or severe pain.     saccharomyces boulardii 250 MG capsule  Commonly known as:  FLORASTOR  Take 1 capsule (250 mg total) by mouth 2 (two) times daily.           Follow-up Information    Follow up with Liz Malady, MD On 01/09/2015.   Specialty:  General Surgery   Why:  arrive by 10:15AM for a 10:50AM post op check   Contact information:   [address] [place] [address] [phone]       Schedule an appointment as soon as possible for a visit with HOSS, ART A, MD.   Specialty:  Interventional Radiology   Contact information:   [address] [place] [address] [phone]        The results of significant diagnostics from this hospitalization (including imaging, microbiology, ancillary and laboratory) are listed below for reference.    Significant Diagnostic Studies: Ct  Abdomen Pelvis W Contrast  12/25/2014  CLINICAL DATA:  Ruptured appendix. EXAM: CT ABDOMEN AND PELVIS WITH CONTRAST TECHNIQUE: Multidetector CT imaging of the abdomen and pelvis was performed using the standard protocol following bolus administration of intravenous contrast. CONTRAST:  OMNIPAQUE IOHEXOL 300 MG/ML  SOLN COMPARISON:  CT scan of December 19, 2014. FINDINGS: Mild bilateral posterior basilar subsegmental atelectasis is noted. No significant osseous abnormality is noted. No gallstones are noted. The spleen and pancreas appear normal. Fluid and air collection noted along inferior margin of right hepatic lobe is significantly larger currently, now measuring 8.2 x 4.0 cm. Calculus remains within it concerning for appendicolith. Adrenal glands and kidneys appear normal. No hydronephrosis or renal obstruction is noted. Surgical drain remains in the right lower quadrant of the abdomen with very small fluid collection remaining, which is significantly smaller compared to prior exam. Fluid collection seen in the pelvis on prior exam is also significantly smaller, measuring 3.1 x 1.2 cm. There is no evidence of bowel obstruction. Urinary bladder appears normal. No significant adenopathy is noted. IMPRESSION: Fluid collections seen in the right lower quadrant of the abdomen as well as in the pelvis are significantly smaller compared to prior exam. However, air and fluid collection seen inferior to right hepatic lobe on prior exam is significantly larger currently, concerning for abscess. These results will be called to the ordering clinician or representative by the Radiologist Assistant, and communication documented in the PACS or zVision Dashboard. Electronically Signed   By: Lupita Raider, M.D.   On: 12/25/2014 15:32   Ct Abdomen Pelvis W Contrast  12/19/2014  CLINICAL DATA:  Persistent mid abdominal pain, status post ruptured appendicitis EXAM: CT ABDOMEN AND PELVIS WITH CONTRAST TECHNIQUE:  Multidetector CT imaging of the abdomen and pelvis was performed using the standard protocol following bolus administration of intravenous contrast. CONTRAST:  OMNIPAQUE IOHEXOL 300 MG/ML  SOLN COMPARISON:  12/13/2014 FINDINGS: There is atelectasis or infiltrate in right lower lobe posteriorly with some air bronchogram. Contracted gallbladder is noted. Small amount of perihepatic inferior ascites. There is a collection adjacent to inferior aspect of the liver with air-fluid level and high-density material centrally probable migrated appendicolith. This is highly suspicious of subcapsular abscess measures 4.6 by 3.1 cm. There is small amount of fluid in right paracolic gutter. Persistent mild inflammatory changes in right lower quadrant post rupture appendicitis. A percutaneous drain is noted in right lower quadrant. Small residual abscess in right lower quadrant measures 2.5 by 2.8 cm. Small probable reactive lymph nodes are noted in right lower quadrant mesentery. There is no evidence of small bowel obstruction. Kidneys are symmetrical in size and enhancement. No hydronephrosis or hydroureter. Small amount of air probable post instrumentation noted within urinary bladder. There is a small collection within pelvis just posterior with urinary bladder with enhancing wall measures 4.3 by 3 cm suspicious for abscess. Axial image 43  there is small amount of soft tissue air within right flank wall. This is probable post surgical in nature. Please see also axial image 53. IMPRESSION: 1. There is a collection adjacent to inferior aspect of the liver with air-fluid level and high-density material centrally probable migrated appendicolith. This is highly suspicious of subcapsular abscess measures 4.6 by 3.1 cm. 2. Persistent inflammatory changes in right lower quadrant post ruptured appendicitis. A percutaneous drain is noted in right lower quadrant. Small residual abscess in right lower quadrant measures 2.5 x 2.8 cm.  Reactive mesenteric lymph nodes are noted in right lower quadrant. 3. There is a pelvic collection just posterior to urinary bladder with enhancing wall measures 4.3 x 3 cm suspicious for pelvic abscess. 4. No small bowel obstruction. 5. No hydronephrosis or hydroureter. Electronically Signed   By: Natasha Mead M.D.   On: 12/19/2014 12:21   Ct Abdomen Pelvis W Contrast  12/13/2014  CLINICAL DATA:  Right lower quadrant pain since yesterday, fever. Patient reports history of prior appendectomy and surgery to deal with infection in [place] earlier this year. EXAM: CT ABDOMEN AND PELVIS WITH CONTRAST TECHNIQUE: Multidetector CT imaging of the abdomen and pelvis was performed using the standard protocol following bolus administration of intravenous contrast. CONTRAST:  OMNIPAQUE IOHEXOL 300 MG/ML  SOLN COMPARISON:  None. FINDINGS: Lower chest: Linear subsegmental atelectasis in the right lower lobe and lingula. Liver: Decreased density consistent with steatosis, no focal lesion. Hepatobiliary: Gallbladder physiologically distended, no calcified gallstone. No biliary dilatation. Pancreas: Normal. Spleen: Normal. Adrenal glands: No nodule. Kidneys: Symmetric renal enhancement.  No hydronephrosis. Stomach/Bowel: Stomach physiologically distended. There are no dilated or thickened small bowel loops. Small volume of stool throughout the colon. Within the right lower quadrant is a dilated blind-ending tubular structure arising from the cecum measuring up to 2.4 cm containing intraluminal high-density material. There is moderate adjacent fat stranding. Small amount of free fluid in the pericolic gutter. There is adjacent thickening at the bases cecum. There are multiple prominent ileocolic lymph nodes, up to 9 mm short axis dimension. Vascular/Lymphatic: No retroperitoneal adenopathy. Abdominal aorta is normal in caliber. Reproductive: Prostate gland normal in size. Bladder: Physiologically distended. Other: No free air  or abscess. Postsurgical change in the anterior abdominal wall. Musculoskeletal: There are no acute or suspicious osseous abnormalities. Incidental note of 6 non-rib-bearing lumbar vertebra. IMPRESSION: Despite patient reported history appendectomy, the blind-ending tubular structure with intraluminal hyperdensity arising from the cecum is most consistent with acute appendicitis. There is a moderate amount of surrounding fat stranding and small volume of free fluid in the right lower quadrant. Multiple prominent lymph nodes in the ileocolic chain, likely reactive. These results were called by telephone at the time of interpretation on 12/13/2014 at 2:31 am to Dr. Tomasita Crumble , who verbally acknowledged these results. Electronically Signed   By: Rubye Oaks M.D.   On: 12/13/2014 02:33   Ct Aspiration  12/20/2014  CLINICAL DATA:  29 year old male with a history of ruptured appendicitis, now with abscess. EXAM: CT GUIDED ASPIRATION BIOPSY OF RIGHT UPPER QUADRANT ABSCESS ANESTHESIA/SEDATION: 1.0  Mg IV Versed; 100 mcg IV Fentanyl Total Moderate Sedation Time: 10 minutes. PROCEDURE: The procedure risks, benefits, and alternatives were explained to the patient. Questions regarding the procedure were encouraged and answered. The patient understands and consents to the procedure. Patient positioned in the prone position on the CT gantry table and a scout CT was performed of the pelvis. Patient was then positioned supine position on the  CT gantry and a scout CT was performed of the upper abdomen. The right upper quadrant was prepped with Betadinein a sterile fashion, and a sterile drape was applied covering the operative field. A sterile gown and sterile gloves were used for the procedure. Local anesthesia was provided with 1% Lidocaine. Once the patient is prepped and draped in the usual sterile fashion, the skin and subcutaneous tissues were generously infiltrated with 1% lidocaine for local anesthesia. Using CT  guidance, a Yueh needle was advanced into a suspected abscess of the right upper quadrant at the inferior liver margin. Once the Yueh needle was confirmed in position, the catheter was advanced off the needle, and approximately 30 cc of brown, foul smelling fluid was aspirated from the cavity. Sterile flush was infused an aspirated. A sample was sent to the lab for analysis. Patient tolerated the procedure well and remained hemodynamically stable throughout. No complications were encountered and no significant blood loss was encountered. FINDINGS: Initial scout CT in the prone position demonstrated smaller appearance of the pelvic fluid collection identified on prior CT study. There was no safe window for approach for aspiration. CT of the abdomen in supine position demonstrates air can fluid collection at the inferior liver margin with radiopaque stone within the collection. Images during the case demonstrate placement of UE needle into the cavity for aspiration. Approximately 30 cc of brown foul smelling fluid were aspirated and sent for lab analysis. IMPRESSION: Status post CT-guided aspiration of right upper quadrant fluid collection with 30 cc of abscess fluid removed. Sample was sent the lab for analysis. The scout CT of the pelvis demonstrates a smaller fluid collection compared to the prior CT, with no safe window for access for aspiration. Signed, Yvone NeuJaime S. Loreta AveWagner, DO [facility] Electronically Signed   By: Gilmer MorJaime  Wagner D.O.   On: 12/20/2014 19:41   Ct Image Guided Fluid Drain By Catheter  12/26/2014  CLINICAL DATA:  Hepatic abscess EXAM: CT IMAGE GUIDED FLUID DRAIN BY CATHETER FLUOROSCOPY TIME:  None MEDICATIONS AND MEDICAL HISTORY: Versed 2 mg, Fentanyl 125 mcg. Additional Medications: None. ANESTHESIA/SEDATION: Moderate sedation time: 20 minutes CONTRAST:  None PROCEDURE: The procedure, risks, benefits, and alternatives were explained to the  patient. Questions regarding the procedure were encouraged and answered. The patient understands and consents to the procedure. The right flank was prepped with ChloraPrep in a sterile fashion, and a sterile drape was applied covering the operative field. A sterile gown and sterile gloves were used for the procedure. Under CT guidance, an 18 gauge needle was inserted into the hepatic abscess and removed over an Amplatz wire. A 10 French dilator followed by a 10 [place] drain were inserted. It was looped and string fixed in the abscess. Pus was aspirated. FINDINGS: Images demonstrate 6610 French abscess drain placement into a lower hepatic abscess. The abscess contains a large calcification. COMPLICATIONS: None IMPRESSION: Successful right hepatic abscess drain. Electronically Signed   By: Jolaine ClickArthur  Hoss M.D.   On: 12/26/2014 15:56    Microbiology: Recent Results (from the past 240 hour(s))  Culture, routine-abscess     Status: None   Collection Time: 12/20/14  3:34 PM  Result Value Ref Range Status   Specimen Description ABSCESS RIGHT ABDOMEN  Final   Special Requests NONE  Final   Gram Stain   Final    ABUNDANT WBC PRESENT,BOTH PMN AND MONONUCLEAR NO SQUAMOUS EPITHELIAL CELLS SEEN ABUNDANT GRAM POSITIVE COCCI IN PAIRS IN CLUSTERS ABUNDANT GRAM NEGATIVE  RODS MODERATE GRAM POSITIVE RODS    Culture   Final    MODERATE ESCHERICHIA COLI Performed at [facility]    Report Status 12/24/2014 FINAL  Final   Organism ID, Bacteria ESCHERICHIA COLI  Final      Susceptibility   Escherichia coli - MIC*    AMPICILLIN >=32 RESISTANT Resistant     AMPICILLIN/SULBACTAM 16 INTERMEDIATE Intermediate     CEFEPIME <=1 SENSITIVE Sensitive     CEFTAZIDIME <=1 SENSITIVE Sensitive     CEFTRIAXONE <=1 SENSITIVE Sensitive     CIPROFLOXACIN >=4 RESISTANT Resistant     GENTAMICIN <=1 SENSITIVE Sensitive     IMIPENEM <=0.25 SENSITIVE Sensitive     PIP/TAZO <=4 SENSITIVE Sensitive     TOBRAMYCIN <=1 SENSITIVE  Sensitive     TRIMETH/SULFA Value in next row Resistant      >=320 RESISTANT(NOTE)    * MODERATE ESCHERICHIA COLI  Culture, routine-abscess     Status: None (Preliminary result)   Collection Time: 12/26/14  4:00 PM  Result Value Ref Range Status   Specimen Description ABSCESS LIVER  Final   Special Requests NONE  Final   Gram Stain   Final    ABUNDANT WBC PRESENT,BOTH PMN AND MONONUCLEAR NO SQUAMOUS EPITHELIAL CELLS SEEN ABUNDANT GRAM POSITIVE RODS ABUNDANT GRAM POSITIVE COCCI IN PAIRS MODERATE GRAM NEGATIVE RODS    Culture   Final    Culture reincubated for better growth Performed at [facility]    Report Status PENDING  Incomplete     Labs: Basic Metabolic Panel:  Recent Labs Lab 12/22/14 0454 12/27/14 0700  NA 134* 134*  K 4.5 5.0  CL 100* 96*  CO2 27 30  GLUCOSE 94 103*  BUN 9 6  CREATININE 0.76 0.79  CALCIUM 9.2 9.1   Liver Function Tests:  Recent Labs Lab 12/22/14 0454  AST 51*  ALT 125*  ALKPHOS 80  BILITOT 0.3  PROT 7.3  ALBUMIN 3.5   No results for input(s): LIPASE, AMYLASE in the last 168 hours. No results for input(s): AMMONIA in the last 168 hours. CBC:  Recent Labs Lab 12/22/14 0454 12/23/14 0456 12/24/14 1044 12/25/14 0638 12/27/14 0700  WBC 13.2* 13.8* 15.2* 18.1* 10.2  HGB 14.6 13.2 14.5 14.2 13.0  HCT 42.5 39.5 42.3 41.9 39.6  MCV 80.6 80.4 80.3 79.8 80.7  PLT 457* 437* 455* 460* 456*   Cardiac Enzymes: No results for input(s): CKTOTAL, CKMB, CKMBINDEX, TROPONINI in the last 168 hours. BNP: BNP (last 3 results) No results for input(s): BNP in the last 8760 hours.  ProBNP (last 3 results) No results for input(s): PROBNP in the last 8760 hours.  CBG: No results for input(s): GLUCAP in the last 168 hours.  Principal Problem:   Perforated appendicitis s/p lap appendectomy 12/13/2014 Active Problems:   Ileus, postoperative   Intraabdominal fluid collection   Time coordinating discharge: <30 mins    Signed:  Taneya Conkel, ANP-BC

## 2014-12-28 NOTE — Progress Notes (Signed)
Patient ID: Dale Crane, male   DOB: 1985-09-20, 29 y.o.   MRN: [id number]     [facility]      [address] [place], [place] [address]    Phone: [phone] FAX: [phone]     Subjective: Had a BM.  Tolerating POs. Afebrile. Pain around IR drain. Surgical drain 80m out IR drain 574mout.   Objective:  Vital signs:  Filed Vitals:   12/27/14 1345 12/27/14 2052 12/28/14 0527 12/28/14 0945  BP: 96/66 100/67 115/64 114/68  Pulse: 91 84 85 85  Temp: 98.7 F (37.1 C) 98.7 F (37.1 C) 98.6 F (37 C) 97.6 F (36.4 C)  TempSrc: Oral Oral Oral   Resp: 16 19 19 18   Height:      Weight:      SpO2: 96% 98% 100% 99%    Last BM Date: 12/25/14  Intake/Output   Yesterday:  12/01 0701 - 12/02 0700 In: 465 [P.O.:450] Out: 364 [Urine:300; Drains:64] This shift:  Total I/O In: 4792.1 [P.O.:360; I.V.:3632.1; IV Piggyback:800] Out: 300 [Urine:300]   Physical Exam: General: Pt awake/alert/oriented x4 in no acute distress Chest: cta. No chest wall pain w good excursion CV: Pulses intact. Regular rhythm MS: Normal AROM mjr joints. No obvious deformity Abdomen: Soft. Nondistended. ttp rlq. Incisions are c/d/i. jp drain with bilious output.lateral drain with purulent drainage. No evidence of peritonitis. No incarcerated hernias. Ext: SCDs BLE. No mjr edema. No cyanosis Skin: No petechiae / purpura   Problem List:   Principal Problem:   Perforated appendicitis s/p lap appendectomy 12/13/2014 Active Problems:   Ileus, postoperative   Intraabdominal fluid collection    Results:   Labs: Results for orders placed or performed during the hospital encounter of 12/13/14 (from the past 48 hour(s))  Culture, routine-abscess     Status: None (Preliminary result)   Collection Time: 12/26/14  4:00 PM  Result Value Ref Range   Specimen Description ABSCESS LIVER    Special Requests NONE    Gram Stain     ABUNDANT WBC PRESENT,BOTH PMN AND MONONUCLEAR NO SQUAMOUS EPITHELIAL CELLS SEEN ABUNDANT GRAM POSITIVE RODS ABUNDANT GRAM POSITIVE COCCI IN PAIRS MODERATE GRAM NEGATIVE RODS    Culture      Culture reincubated for better growth Performed at [facility]  Report Status PENDING   CBC     Status: Abnormal   Collection Time: 12/27/14  7:00 AM  Result Value Ref Range   WBC 10.2 4.0 - 10.5 K/uL   RBC 4.91 4.22 - 5.81 MIL/uL   Hemoglobin 13.0 13.0 - 17.0 g/dL   HCT 39.6 39.0 - 52.0 %   MCV 80.7 78.0 - 100.0 fL   MCH 26.5 26.0 - 34.0 pg   MCHC 32.8 30.0 - 36.0 g/dL   RDW 12.6 11.5 - 15.5 %   Platelets 456 (H) 150 - 400 K/uL  Basic metabolic panel     Status: Abnormal   Collection Time: 12/27/14  7:00 AM  Result Value Ref Range   Sodium 134 (L) 135 - 145 mmol/L   Potassium 5.0 3.5 - 5.1 mmol/L   Chloride 96 (L) 101 - 111 mmol/L   CO2 30 22 - 32 mmol/L   Glucose, Bld 103 (H) 65 - 99 mg/dL   BUN 6 6 - 20 mg/dL   Creatinine, Ser 0.79 0.61 - 1.24 mg/dL   Calcium 9.1 8.9 - 10.3 mg/dL   GFR calc non Af Amer >  60 >60 mL/min   GFR calc Af Amer >60 >60 mL/min    Comment: (NOTE) The eGFR has been calculated using the CKD EPI equation. This calculation has not been validated in all clinical situations. eGFR's persistently <60 mL/min signify possible Chronic Kidney Disease.    Anion gap 8 5 - 15    Imaging / Studies: Ct Image Guided Fluid Drain By Catheter  12/26/2014  CLINICAL DATA:  Hepatic abscess EXAM: CT IMAGE GUIDED FLUID DRAIN BY CATHETER FLUOROSCOPY TIME:  None MEDICATIONS AND MEDICAL HISTORY: Versed 2 mg, Fentanyl 125 mcg. Additional Medications: None. ANESTHESIA/SEDATION: Moderate sedation time: 20 minutes CONTRAST:  None PROCEDURE: The procedure, risks, benefits, and alternatives were explained to the patient. Questions regarding the procedure were encouraged and answered. The patient understands and consents to the procedure. The right flank was prepped with ChloraPrep  in a sterile fashion, and a sterile drape was applied covering the operative field. A sterile gown and sterile gloves were used for the procedure. Under CT guidance, an 18 gauge needle was inserted into the hepatic abscess and removed over an Amplatz wire. A 10 French dilator followed by a 10 [place] drain were inserted. It was looped and string fixed in the abscess. Pus was aspirated. FINDINGS: Images demonstrate 73 French abscess drain placement into a lower hepatic abscess. The abscess contains a large calcification. COMPLICATIONS: None IMPRESSION: Successful right hepatic abscess drain. Electronically Signed   By: Marybelle Killings M.D.   On: 12/26/2014 15:56    Medications / Allergies:  Scheduled Meds: . enoxaparin (LOVENOX) injection  40 mg Subcutaneous Q24H  . ertapenem  1 g Intravenous Q24H  . saccharomyces boulardii  250 mg Oral BID   Continuous Infusions: . 0.9 % NaCl with KCl 20 mEq / L 50 mL/hr at 12/27/14 0508  . lactated ringers     PRN Meds:.acetaminophen, diphenhydrAMINE **OR** diphenhydrAMINE, morphine injection, ondansetron **OR** ondansetron (ZOFRAN) IV, oxyCODONE-acetaminophen  Antibiotics: Anti-infectives    Start     Dose/Rate Route Frequency Ordered Stop   12/25/14 1630  ertapenem (INVANZ) 1 g in sodium chloride 0.9 % 50 mL IVPB     1 g 100 mL/hr over 30 Minutes Intravenous Every 24 hours 12/25/14 1556     12/24/14 1100  cefpodoxime (VANTIN) tablet 400 mg  Status:  Discontinued     400 mg Oral Every 12 hours 12/24/14 1001 12/25/14 1556   12/21/14 1000  amoxicillin-clavulanate (AUGMENTIN) 875-125 MG per tablet 1 tablet  Status:  Discontinued     1 tablet Oral Every 12 hours 12/21/14 0913 12/24/14 1001   12/19/14 1400  piperacillin-tazobactam (ZOSYN) IVPB 3.375 g  Status:  Discontinued     3.375 g 12.5 mL/hr over 240 Minutes Intravenous 3 times per day 12/19/14 1343 12/21/14 0913   12/18/14 1045  amoxicillin-clavulanate (AUGMENTIN) 875-125 MG per tablet 1 tablet  Status:   Discontinued     1 tablet Oral Every 12 hours 12/18/14 1031 12/19/14 1302   12/13/14 1000  piperacillin-tazobactam (ZOSYN) IVPB 3.375 g  Status:  Discontinued     3.375 g 12.5 mL/hr over 240 Minutes Intravenous 3 times per day 12/13/14 0345 12/18/14 1031   12/13/14 0600  piperacillin-tazobactam (ZOSYN) IVPB 3.375 g  Status:  Discontinued     3.375 g 12.5 mL/hr over 240 Minutes Intravenous 3 times per day 12/13/14 0332 12/13/14 0345   12/13/14 0245  piperacillin-tazobactam (ZOSYN) IVPB 3.375 g     3.375 g 100 mL/hr over 30 Minutes Intravenous  Once 12/13/14  0230 12/13/14 0306        Assessment/Plan 2 previous appendectomies in [place]. Most recent CT suggests an appendicolith  Ruptured appendix POD#15 laparoscopic appendectomy with LOA---Dr. Grandville Silos 12/13/14 Intra-abdominal fluid collections -s/p IR drain placement 11/30 -continue surgical drain(looks bilious, very little output and tolerating POs) -shower, mobilize, IS ID-zosyn D#9, Invanz 11/29--->.  Most recent cultures GPC, GPR, GNR,  FEN-no issues VTE prophylaxis-SCD/lovenox Dispo-inpatient  Spoke via pacific interpreter(Ricardo)   Erby Pian, [facility] Pager [phone]) For consults and floor pages call [phone]([id number])  12/28/2014 9:50 AM

## 2014-12-28 NOTE — Discharge Instructions (Signed)

## 2014-12-28 NOTE — Progress Notes (Signed)
Pt a/o, states he has little pain but did not want pain meds, RUQ drain irrigated with 5 cc and put out 9 cc of purulent drainage, pt remains on IVF NS with 20 mEq K+ @ 50 ml/hr, VSS, pt stable

## 2014-12-28 NOTE — Care Management Note (Addendum)
Case Management Note  Patient Details  Name: Dale Crane MRN: [id number] Date of Birth: [birth year]  Subjective/Objective:                    Action/Plan:  Patient unable to afford nursing visits . Nurse will give patient instructions in Spanish on drain care . [name] aware.   New MATCH letter with 14 day exception for Percocet entered .   HHRN set up with [facility] .   Patient's [place] is [name] , [name] , [place] [address] phone number is [phone]   Bedside nurse will send some flushes with patient .   Above explained to patient via  Interpreter Dale Crane         Expected Discharge Date:                  Expected Discharge Plan:  Home w Home Health Services  In-House Referral:     Discharge planning Services  CM Consult, [facility], [facility]  Post Acute Care Choice:    Choice offered to:  Patient  DME Arranged:    DME Agency:     HH Arranged:  RN HH Agency:  [facility]  Status of Service:  Completed, signed off  Medicare Important Message Given:    Date Medicare IM Given:    Medicare IM give by:    Date Additional Medicare IM Given:    Additional Medicare Important Message give by:     If discussed at Long Length of Stay Meetings, dates discussed:    Additional Comments:  Kingsley PlanWile, Velecia Ovitt Marie, RN 12/28/2014, 12:15 PM

## 2014-12-28 NOTE — Progress Notes (Signed)
RUQ drain had 20 cc purulent drainage, RLQ had about 5 cc of green/bile drainage

## 2014-12-30 LAB — CULTURE, ROUTINE-ABSCESS

## 2014-12-31 ENCOUNTER — Other Ambulatory Visit: Payer: Self-pay | Admitting: Radiology

## 2014-12-31 ENCOUNTER — Other Ambulatory Visit (HOSPITAL_COMMUNITY): Payer: Self-pay | Admitting: Interventional Radiology

## 2014-12-31 DIAGNOSIS — IMO0001 Reserved for inherently not codable concepts without codable children: Secondary | ICD-10-CM

## 2014-12-31 DIAGNOSIS — T814XXD Infection following a procedure, subsequent encounter: Principal | ICD-10-CM

## 2014-12-31 DIAGNOSIS — K651 Peritoneal abscess: Principal | ICD-10-CM

## 2015-01-08 ENCOUNTER — Ambulatory Visit
Admission: RE | Admit: 2015-01-08 | Discharge: 2015-01-08 | Disposition: A | Discharge status: Home / Self Care | Payer: No Typology Code available for payment source | Source: Ambulatory Visit | Attending: Radiology | Admitting: Radiology

## 2015-01-08 ENCOUNTER — Ambulatory Visit
Admission: RE | Admit: 2015-01-08 | Discharge: 2015-01-08 | Disposition: A | Discharge status: Home / Self Care | Payer: No Typology Code available for payment source | Source: Ambulatory Visit | Attending: Interventional Radiology | Admitting: Interventional Radiology

## 2015-01-08 ENCOUNTER — Other Ambulatory Visit (HOSPITAL_COMMUNITY): Payer: Self-pay | Admitting: Interventional Radiology

## 2015-01-08 ENCOUNTER — Other Ambulatory Visit: Payer: Self-pay | Admitting: General Surgery

## 2015-01-08 ENCOUNTER — Other Ambulatory Visit: Payer: Self-pay | Admitting: Radiology

## 2015-01-08 DIAGNOSIS — T814XXD Infection following a procedure, subsequent encounter: Principal | ICD-10-CM

## 2015-01-08 DIAGNOSIS — IMO0001 Reserved for inherently not codable concepts without codable children: Secondary | ICD-10-CM

## 2015-01-08 DIAGNOSIS — K651 Peritoneal abscess: Principal | ICD-10-CM

## 2015-01-08 DIAGNOSIS — T8143XA Infection following a procedure, organ and space surgical site, initial encounter: Secondary | ICD-10-CM | POA: Insufficient documentation

## 2015-01-08 DIAGNOSIS — T8149XA Infection following a procedure, other surgical site, initial encounter: Secondary | ICD-10-CM

## 2015-01-08 MED ORDER — IOPAMIDOL (ISOVUE-300) INJECTION 61%
125.0000 mL | Freq: Once | INTRAVENOUS | Status: AC | PRN
  Administered 2015-01-08: 125 mL via INTRAVENOUS

## 2015-01-08 NOTE — Progress Notes (Signed)
Chief Complaint: Patient was seen in consultation today for perihepatic abscess drain care at the request of Violeta Gelinas  Referring Physician(s): Violeta Gelinas  History of Present Illness: Dale Crane is a 29 y.o. male with a history of ruptured appendicitis status post appendectomy, located by intra-abdominal abscess formation associated with a migrated appendicolith in the perihepatic space.  A percutaneous drainage catheter was placed under CT guidance on 12/26/2014. Today, the patient is essentially asymptomatic. Denies abdominal pain, vomiting, diarrhea, fever or chills. He flushes both drains daily. The drain in his low midline abdomen which is a surgically placed Blake drain is draining clear serous fluid with a slight green tinge. The drain in the right upper quadrant which was placed under CT guidance is draining a proximally 20-30 mL thick purulent material daily.  No past medical history on file.  Past Surgical History  Procedure Laterality Date  . Appendectomy    . Laparoscopic appendectomy N/A 12/13/2014    Procedure: APPENDECTOMY LAPAROSCOPIC FOR RUPTURED APPENDIX; LAPAROSCOPIC LYSIS OF ADHESIONS 45 minutes;  Surgeon: Violeta Gelinas, MD;  Location: [facility] OR;  Service: General;  Laterality: N/A;    Allergies: Review of patient's allergies indicates no known allergies.  Medications: Prior to Admission medications   Medication Sig Start Date End Date Taking? Authorizing Provider  cefpodoxime (VANTIN) 200 MG tablet Take 2 tablets (400 mg total) by mouth 2 (two) times daily. 12/28/14   Ashok Norris, NP  oxyCODONE-acetaminophen (PERCOCET/ROXICET) 5-325 MG tablet Take 1-2 tablets by mouth every 4 (four) hours as needed for moderate pain or severe pain. 12/28/14   [name] Riebock, NP  saccharomyces boulardii (FLORASTOR) 250 MG capsule Take 1 capsule (250 mg total) by mouth 2 (two) times daily. 12/28/14   Ashok Norris, NP     No family history on file.  Social  History   Social History  . Marital Status: Significant Other    Spouse Name: N/A  . Number of Children: N/A  . Years of Education: N/A   Social History Main Topics  . Smoking status: Never Smoker   . Smokeless tobacco: Not on file  . Alcohol Use: Yes  . Drug Use: No  . Sexual Activity: Not on file   Other Topics Concern  . Not on file   Social History Narrative   Review of Systems: A 12 point ROS discussed and pertinent positives are indicated in the HPI above.  All other systems are negative.  Review of Systems  Vital Signs: BP 113/67 mmHg  Pulse 87  Temp(Src) 98.5 F (36.9 C) (Oral)  SpO2 98%  Physical Exam  Constitutional: He appears well-developed and well-nourished. No distress.  HENT:  Head: Normocephalic and atraumatic.  Eyes: No scleral icterus.  Cardiovascular: Normal rate and regular rhythm.   Pulmonary/Chest: Effort normal.  Abdominal: Soft. He exhibits no distension. There is no tenderness.    Nursing note and vitals reviewed.   Imaging: Ct Abdomen Pelvis W Contrast  01/08/2015  CLINICAL DATA:  29 year old male with a history of acute appendicitis complicated by perihepatic post appendectomy abscess with an associated migrated appendicolith. He underwent percutaneous drain placement on 11/30 2016. EXAM: CT ABDOMEN AND PELVIS WITH CONTRAST TECHNIQUE: Multidetector CT imaging of the abdomen and pelvis was performed using the standard protocol following bolus administration of intravenous contrast. CONTRAST:  ISOVUE-300 IOPAMIDOL (ISOVUE-300) INJECTION 61% COMPARISON:  Prior CT abdomen/ pelvis 12/25/2014 FINDINGS: Lower Chest: Mild dependent atelectasis in the right lower lobe. Stable small left lower lobe pulmonary  nodules which are almost certainly the sequelae of a a prior infectious/ inflammatory or granulomatous process given the patient's age. The largest nodule size is 5 mm. Visualized cardiac structures are within normal limits for size. No  pericardial effusion. Unremarkable distal thoracic esophagus. Abdomen: Normal CT appearance of the stomach, duodenum, spleen, adrenal glands and pancreas. Normal hepatic contour and morphology. Significant interval improvement in the fluid and gas collection at the inferior margin of the liver which now measures only 2.1 x 1.6 cm. The fluid collection is adjacent to the surgical drainage catheter. Stable position of 1.5 cm appendicolith. Positioned adjacent to the residual fluid collection and drainage catheter. Unremarkable appearance of the bilateral kidneys. No focal solid lesion, hydronephrosis or nephrolithiasis. Surgical changes of prior appendectomy. The surgical blade strain is in good position. There is no residual fluid collection in the right lower quadrant. No focal bowel wall thickening or evidence obstruction. No suspicious adenopathy or new free fluid. Pelvis: Unremarkable bladder, prostate gland and seminal vesicles. No free fluid or suspicious adenopathy. Bones/Soft Tissues: No acute fracture or aggressive appearing lytic or blastic osseous lesion. Vascular: No significant atherosclerotic vascular disease, aneurysmal dilatation or acute abnormality. IMPRESSION: 1. Significant interval improvement and perihepatic abscess with a small residual 2.1 x 1.6 cm peripherally enhancing fluid collection adjacent to the well-positioned drainage catheter and migrated appendicolith. 2. Surgical changes of appendectomy with a blade drain in the right lower quadrant operative bed. No evidence of residual fluid collection in this region. Signed, Sterling BigHeath K. Rogan Wigley, MD [facility] Electronically Signed   By: Malachy MoanHeath  Slayden Mennenga M.D.   On: 01/08/2015 10:57   Ct Abdomen Pelvis W Contrast  12/25/2014  CLINICAL DATA:  Ruptured appendix. EXAM: CT ABDOMEN AND PELVIS WITH CONTRAST TECHNIQUE: Multidetector CT imaging of the abdomen and pelvis was performed using  the standard protocol following bolus administration of intravenous contrast. CONTRAST:  100mL OMNIPAQUE IOHEXOL 300 MG/ML  SOLN COMPARISON:  CT scan of December 19, 2014. FINDINGS: Mild bilateral posterior basilar subsegmental atelectasis is noted. No significant osseous abnormality is noted. No gallstones are noted. The spleen and pancreas appear normal. Fluid and air collection noted along inferior margin of right hepatic lobe is significantly larger currently, now measuring 8.2 x 4.0 cm. Calculus remains within it concerning for appendicolith. Adrenal glands and kidneys appear normal. No hydronephrosis or renal obstruction is noted. Surgical drain remains in the right lower quadrant of the abdomen with very small fluid collection remaining, which is significantly smaller compared to prior exam. Fluid collection seen in the pelvis on prior exam is also significantly smaller, measuring 3.1 x 1.2 cm. There is no evidence of bowel obstruction. Urinary bladder appears normal. No significant adenopathy is noted. IMPRESSION: Fluid collections seen in the right lower quadrant of the abdomen as well as in the pelvis are significantly smaller compared to prior exam. However, air and fluid collection seen inferior to right hepatic lobe on prior exam is significantly larger currently, concerning for abscess. These results will be called to the ordering clinician or representative by the Radiologist Assistant, and communication documented in the PACS or zVision Dashboard. Electronically Signed   By: Lupita RaiderJames  Green Jr, M.D.   On: 12/25/2014 15:32   Ct Abdomen Pelvis W Contrast  12/19/2014  CLINICAL DATA:  Persistent mid abdominal pain, status post ruptured appendicitis EXAM: CT ABDOMEN AND PELVIS WITH CONTRAST TECHNIQUE: Multidetector CT imaging of the abdomen and pelvis was performed using the standard protocol following bolus administration  of intravenous contrast. CONTRAST:  OMNIPAQUE IOHEXOL 300 MG/ML  SOLN  COMPARISON:  12/13/2014 FINDINGS: There is atelectasis or infiltrate in right lower lobe posteriorly with some air bronchogram. Contracted gallbladder is noted. Small amount of perihepatic inferior ascites. There is a collection adjacent to inferior aspect of the liver with air-fluid level and high-density material centrally probable migrated appendicolith. This is highly suspicious of subcapsular abscess measures 4.6 by 3.1 cm. There is small amount of fluid in right paracolic gutter. Persistent mild inflammatory changes in right lower quadrant post rupture appendicitis. A percutaneous drain is noted in right lower quadrant. Small residual abscess in right lower quadrant measures 2.5 by 2.8 cm. Small probable reactive lymph nodes are noted in right lower quadrant mesentery. There is no evidence of small bowel obstruction. Kidneys are symmetrical in size and enhancement. No hydronephrosis or hydroureter. Small amount of air probable post instrumentation noted within urinary bladder. There is a small collection within pelvis just posterior with urinary bladder with enhancing wall measures 4.3 by 3 cm suspicious for abscess. Axial image 43 there is small amount of soft tissue air within right flank wall. This is probable post surgical in nature. Please see also axial image 53. IMPRESSION: 1. There is a collection adjacent to inferior aspect of the liver with air-fluid level and high-density material centrally probable migrated appendicolith. This is highly suspicious of subcapsular abscess measures 4.6 by 3.1 cm. 2. Persistent inflammatory changes in right lower quadrant post ruptured appendicitis. A percutaneous drain is noted in right lower quadrant. Small residual abscess in right lower quadrant measures 2.5 x 2.8 cm. Reactive mesenteric lymph nodes are noted in right lower quadrant. 3. There is a pelvic collection just posterior to urinary bladder with enhancing wall measures 4.3 x 3 cm suspicious for pelvic  abscess. 4. No small bowel obstruction. 5. No hydronephrosis or hydroureter. Electronically Signed   By: Natasha Mead M.D.   On: 12/19/2014 12:21   Ct Abdomen Pelvis W Contrast  12/13/2014  CLINICAL DATA:  Right lower quadrant pain since yesterday, fever. Patient reports history of prior appendectomy and surgery to deal with infection in [place] earlier this year. EXAM: CT ABDOMEN AND PELVIS WITH CONTRAST TECHNIQUE: Multidetector CT imaging of the abdomen and pelvis was performed using the standard protocol following bolus administration of intravenous contrast. CONTRAST:  OMNIPAQUE IOHEXOL 300 MG/ML  SOLN COMPARISON:  None. FINDINGS: Lower chest: Linear subsegmental atelectasis in the right lower lobe and lingula. Liver: Decreased density consistent with steatosis, no focal lesion. Hepatobiliary: Gallbladder physiologically distended, no calcified gallstone. No biliary dilatation. Pancreas: Normal. Spleen: Normal. Adrenal glands: No nodule. Kidneys: Symmetric renal enhancement.  No hydronephrosis. Stomach/Bowel: Stomach physiologically distended. There are no dilated or thickened small bowel loops. Small volume of stool throughout the colon. Within the right lower quadrant is a dilated blind-ending tubular structure arising from the cecum measuring up to 2.4 cm containing intraluminal high-density material. There is moderate adjacent fat stranding. Small amount of free fluid in the pericolic gutter. There is adjacent thickening at the bases cecum. There are multiple prominent ileocolic lymph nodes, up to 9 mm short axis dimension. Vascular/Lymphatic: No retroperitoneal adenopathy. Abdominal aorta is normal in caliber. Reproductive: Prostate gland normal in size. Bladder: Physiologically distended. Other: No free air or abscess. Postsurgical change in the anterior abdominal wall. Musculoskeletal: There are no acute or suspicious osseous abnormalities. Incidental note of 6 non-rib-bearing lumbar vertebra.  IMPRESSION: Despite patient reported history appendectomy, the blind-ending tubular structure with intraluminal  hyperdensity arising from the cecum is most consistent with acute appendicitis. There is a moderate amount of surrounding fat stranding and small volume of free fluid in the right lower quadrant. Multiple prominent lymph nodes in the ileocolic chain, likely reactive. These results were called by telephone at the time of interpretation on 12/13/2014 at 2:31 am to Dr. Tomasita Crumble , who verbally acknowledged these results. Electronically Signed   By: Rubye Oaks M.D.   On: 12/13/2014 02:33   Ct Aspiration  12/20/2014  CLINICAL DATA:  29 year old male with a history of ruptured appendicitis, now with abscess. EXAM: CT GUIDED ASPIRATION BIOPSY OF RIGHT UPPER QUADRANT ABSCESS ANESTHESIA/SEDATION: 1.0  Mg IV Versed; 100 mcg IV Fentanyl Total Moderate Sedation Time: 10 minutes. PROCEDURE: The procedure risks, benefits, and alternatives were explained to the patient. Questions regarding the procedure were encouraged and answered. The patient understands and consents to the procedure. Patient positioned in the prone position on the CT gantry table and a scout CT was performed of the pelvis. Patient was then positioned supine position on the CT gantry and a scout CT was performed of the upper abdomen. The right upper quadrant was prepped with Betadinein a sterile fashion, and a sterile drape was applied covering the operative field. A sterile gown and sterile gloves were used for the procedure. Local anesthesia was provided with 1% Lidocaine. Once the patient is prepped and draped in the usual sterile fashion, the skin and subcutaneous tissues were generously infiltrated with 1% lidocaine for local anesthesia. Using CT guidance, a Yueh needle was advanced into a suspected abscess of the right upper quadrant at the inferior liver margin. Once the Yueh needle was confirmed in position, the catheter was  advanced off the needle, and approximately 30 cc of brown, foul smelling fluid was aspirated from the cavity. Sterile flush was infused an aspirated. A sample was sent to the lab for analysis. Patient tolerated the procedure well and remained hemodynamically stable throughout. No complications were encountered and no significant blood loss was encountered. FINDINGS: Initial scout CT in the prone position demonstrated smaller appearance of the pelvic fluid collection identified on prior CT study. There was no safe window for approach for aspiration. CT of the abdomen in supine position demonstrates air can fluid collection at the inferior liver margin with radiopaque stone within the collection. Images during the case demonstrate placement of UE needle into the cavity for aspiration. Approximately 30 cc of brown foul smelling fluid were aspirated and sent for lab analysis. IMPRESSION: Status post CT-guided aspiration of right upper quadrant fluid collection with 30 cc of abscess fluid removed. Sample was sent the lab for analysis. The scout CT of the pelvis demonstrates a smaller fluid collection compared to the prior CT, with no safe window for access for aspiration. Signed, Yvone Neu. Loreta Ave, DO [facility] Electronically Signed   By: Gilmer Mor D.O.   On: 12/20/2014 19:41   Ct Image Guided Fluid Drain By Catheter  12/26/2014  CLINICAL DATA:  Hepatic abscess EXAM: CT IMAGE GUIDED FLUID DRAIN BY CATHETER FLUOROSCOPY TIME:  None MEDICATIONS AND MEDICAL HISTORY: Versed 2 mg, Fentanyl 125 mcg. Additional Medications: None. ANESTHESIA/SEDATION: Moderate sedation time: 20 minutes CONTRAST:  None PROCEDURE: The procedure, risks, benefits, and alternatives were explained to the patient. Questions regarding the procedure were encouraged and answered. The patient understands and consents to the procedure. The right flank was prepped with ChloraPrep in a sterile  fashion, and a sterile  drape was applied covering the operative field. A sterile gown and sterile gloves were used for the procedure. Under CT guidance, an 18 gauge needle was inserted into the hepatic abscess and removed over an Amplatz wire. A 10 French dilator followed by a 10 [place] drain were inserted. It was looped and string fixed in the abscess. Pus was aspirated. FINDINGS: Images demonstrate 33 French abscess drain placement into a lower hepatic abscess. The abscess contains a large calcification. COMPLICATIONS: None IMPRESSION: Successful right hepatic abscess drain. Electronically Signed   By: Jolaine Click M.D.   On: 12/26/2014 15:56    Labs:  CBC:  Recent Labs  12/23/14 0456 12/24/14 1044 12/25/14 0638 12/27/14 0700  WBC 13.8* 15.2* 18.1* 10.2  HGB 13.2 14.5 14.2 13.0  HCT 39.5 42.3 41.9 39.6  PLT 437* 455* 460* 456*    COAGS:  Recent Labs  12/20/14 0225  INR 1.15  APTT 30    BMP:  Recent Labs  12/18/14 0308 12/20/14 0225 12/22/14 0454 12/27/14 0700  NA 134* 134* 134* 134*  K 3.8 4.1 4.5 5.0  CL 102 98* 100* 96*  CO2 GLUCOSE 117* 99 94 103*  BUN CALCIUM 8.9 8.9 9.2 9.1  CREATININE 0.73 0.74 0.76 0.79  GFRNONAA >60 >60 >60 >60  GFRAA >60 >60 >60 >60    LIVER FUNCTION TESTS:  Recent Labs  12/13/14 0024 12/22/14 0454  BILITOT 0.9 0.3  AST 23 51*  ALT 40 125*  ALKPHOS 59 80  PROT 7.8 7.3  ALBUMIN 4.6 3.5    TUMOR MARKERS: No results for input(s): AFPTM, CEA, CA199, CHROMGRNA in the last 8760 hours.  Assessment and Plan:  Persistent but significantly improved perihepatic abscess collection with well-positioned percutaneous drainage catheter. The catheter continues to put out 20-30 mL purulent fluid daily. The retained appendicolith remains in unchanged position interposed between the drainage catheter and the residual abscess collection.  The surgical Harrison Mons drain puts out only clear serous fluid and there is no evidence  of residual abscess cavity on CT imaging.  Findings were discussed with this gentleman's surgeon, Dr. Janee Morn.  We are in agreement that the surgical Harrison Mons drain can be removed at this time but the perihepatic drainage catheter must remain in place. Ultimately, it may be possible that an additional surgery is required to remove the retained appendicolith if there is persistent purulent drainage from the percutaneous catheter, or if there is recurrent abscess in the future after successful initial treatment.  1.) Return to drained clinic in 3 weeks with CT scan of the abdomen with contrast material. If the residual abscess collection has resolved and the drain output has cleared, the drainage catheter may be removed at that time.    SignedMalachy Moan 01/08/2015, 3:31 PM   I spent a total of 15 Minutes in face to face in clinical consultation, greater than 50% of which was counseling/coordinating care for perihepatic abscess.

## 2015-01-29 ENCOUNTER — Ambulatory Visit
Admission: RE | Admit: 2015-01-29 | Discharge: 2015-01-29 | Disposition: A | Discharge status: Home / Self Care | Payer: No Typology Code available for payment source | Source: Ambulatory Visit | Attending: Radiology | Admitting: Radiology

## 2015-01-29 ENCOUNTER — Ambulatory Visit
Admission: RE | Admit: 2015-01-29 | Discharge: 2015-01-29 | Disposition: A | Discharge status: Home / Self Care | Payer: No Typology Code available for payment source | Source: Ambulatory Visit | Attending: Interventional Radiology | Admitting: Interventional Radiology

## 2015-01-29 DIAGNOSIS — T814XXD Infection following a procedure, subsequent encounter: Principal | ICD-10-CM

## 2015-01-29 DIAGNOSIS — IMO0001 Reserved for inherently not codable concepts without codable children: Secondary | ICD-10-CM

## 2015-01-29 DIAGNOSIS — K651 Peritoneal abscess: Principal | ICD-10-CM

## 2015-01-29 MED ORDER — IOPAMIDOL (ISOVUE-300) INJECTION 61%
100.0000 mL | Freq: Once | INTRAVENOUS | Status: AC | PRN
  Administered 2015-01-29: 100 mL via INTRAVENOUS

## 2015-01-29 NOTE — Progress Notes (Signed)
Chief Complaint: Status post percutaneous drainage of infrahepatic and adjacent intrahepatic abscess after surgery for ruptured appendicitis.  History of Present Illness: Dale Crane is a 30 y.o. male who had an appendectomy on 12/13/2014 by Dr. Janee Mornhompson for ruptured appendicitis. A postoperative abscess developed adjacent to the inferior margin of the right lobe of the liver extending into the liver parenchyma. This was treated with a percutaneous drain on 12/26/2014. Follow-up CT on 01/08/2015 demonstrated minimal adjacent fluid. Follow-up CT and evaluation is performed today. The patient reports minimal fluid drainage from the catheter over the last 2 weeks.  No past medical history on file.  Past Surgical History  Procedure Laterality Date  . Appendectomy    . Laparoscopic appendectomy N/A 12/13/2014    Procedure: APPENDECTOMY LAPAROSCOPIC FOR RUPTURED APPENDIX; LAPAROSCOPIC LYSIS OF ADHESIONS 45 minutes;  Surgeon: Violeta GelinasBurke Thompson, MD;  Location: [facility] OR;  Service: General;  Laterality: N/A;    Allergies: Review of patient's allergies indicates no known allergies.  Medications: Prior to Admission medications   Medication Sig Start Date End Date Taking? Authorizing Provider  ciprofloxacin (CIPRO) 500 MG tablet Take 500 mg by mouth 2 (two) times daily.   Yes Historical Provider, MD  cefpodoxime (VANTIN) 200 MG tablet Take 2 tablets (400 mg total) by mouth 2 (two) times daily. 12/28/14   Ashok NorrisEmina Riebock, NP  oxyCODONE-acetaminophen (PERCOCET/ROXICET) 5-325 MG tablet Take 1-2 tablets by mouth every 4 (four) hours as needed for moderate pain or severe pain. 12/28/14   [name] Riebock, NP  saccharomyces boulardii (FLORASTOR) 250 MG capsule Take 1 capsule (250 mg total) by mouth 2 (two) times daily. 12/28/14   Ashok NorrisEmina Riebock, NP     No family history on file.  Social History   Social History  . Marital Status: Significant Other    Spouse Name: N/A  . Number of Children: N/A    . Years of Education: N/A   Social History Main Topics  . Smoking status: Never Smoker   . Smokeless tobacco: Not on file  . Alcohol Use: Yes  . Drug Use: No  . Sexual Activity: Not on file   Other Topics Concern  . Not on file   Social History Narrative    Review of Systems: A 12 point ROS discussed and pertinent positives are indicated in the HPI above.  All other systems are negative.  Review of Systems  Constitutional: Negative.   Gastrointestinal: Negative.      Vital Signs: BP 119/69 mmHg  Pulse 92  Temp(Src) 98.7 F (37.1 C) (Oral)  Resp 14  SpO2 100%  Physical Exam  Abdominal: Soft. He exhibits no distension. There is no tenderness. There is no rebound and no guarding.  Right lateral drain site shows mild erythema at catheter exit site.    Mallampati Score:     Imaging: Ct Abdomen W Contrast  01/29/2015  CLINICAL DATA:  Perforated appendicitis status post laparoscopic appendectomy on 12/13/2014, complicated by perihepatic abscess requiring percutaneous drainage, presenting for follow-up. EXAM: CT ABDOMEN WITH CONTRAST TECHNIQUE: Multidetector CT imaging of the abdomen was performed using the standard protocol following bolus administration of intravenous contrast. CONTRAST:  100mL ISOVUE-300 IOPAMIDOL (ISOVUE-300) INJECTION 61% COMPARISON:  01/08/2015 CT abdomen/ pelvis. FINDINGS: Lower chest: Anterior right middle lobe subpleural 3 mm pulmonary nodule (series 5/image 4) is stable since 12/13/2014. Two tiny left lower lobe solid pulmonary nodules, largest 4 mm (series 5/image 80), stable since 12/13/2014. Hepatobiliary: Normal liver with no liver mass. Normal gallbladder with no  radiopaque cholelithiasis. No biliary ductal dilatation. Pancreas: Normal, with no mass or duct dilation. Spleen: Normal size. No mass. Adrenals/Urinary Tract: Normal adrenals. A punctate 1 mm density in the anterior lower right renal collecting system could represent a tiny nonobstructing  stone versus early contrast excretion. Otherwise normal kidneys, with no renal mass and no hydronephrosis. Stomach/Bowel: Grossly normal stomach. Visualized small and large bowel is normal caliber, with no bowel wall thickening. The patient is status post appendectomy. There has been interval removal of the surgical drain at the appendectomy bed. No recurrent fluid collection at the appendectomy bed. Vascular/Lymphatic: Normal caliber abdominal aorta. Patent portal, splenic, hepatic and renal veins. No pathologically enlarged lymph nodes in the abdomen. Other: Lateral right upper abdominal approach transhepatic percutaneous pigtail drainage catheter terminates in the posterior inferior right perihepatic space. No residual or new fluid collection is seen in the perihepatic space. There is a persistent calcified 14 mm appendicolith in the posterior inferior right perihepatic space adjacent to the pigtail drainage catheter tip. No pneumoperitoneum or new intra-abdominal fluid collections. Ascites. Musculoskeletal: No aggressive appearing focal osseous lesions. IMPRESSION: 1. No residual perihepatic fluid collection. Persistent appendicolith adjacent to the pigtail drainage catheter in the posterior inferior perihepatic space. 2. Interval removal of the surgical drain from the appendectomy bed, with no fluid collection in the surgical bed. 3. Tiny pulmonary nodules at the lung bases, largest 4 mm, for which 1 month stability has been demonstrated. If the patient is at high risk for bronchogenic carcinoma, follow-up chest CT at 1 year is recommended. If the patient is at low risk, no follow-up is needed. This recommendation follows the consensus statement: Guidelines for Management of Small Pulmonary Nodules Detected on CT Scans: A Statement from the Fleischner Society as published in Radiology 2005; 237:395-400. Electronically Signed   By: Delbert Phenix M.D.   On: 01/29/2015 10:17   Ct Abdomen Pelvis W  Contrast  01/08/2015  CLINICAL DATA:  30 year old male with a history of acute appendicitis complicated by perihepatic post appendectomy abscess with an associated migrated appendicolith. He underwent percutaneous drain placement on 11/30 2016. EXAM: CT ABDOMEN AND PELVIS WITH CONTRAST TECHNIQUE: Multidetector CT imaging of the abdomen and pelvis was performed using the standard protocol following bolus administration of intravenous contrast. CONTRAST:  ISOVUE-300 IOPAMIDOL (ISOVUE-300) INJECTION 61% COMPARISON:  Prior CT abdomen/ pelvis 12/25/2014 FINDINGS: Lower Chest: Mild dependent atelectasis in the right lower lobe. Stable small left lower lobe pulmonary nodules which are almost certainly the sequelae of a a prior infectious/ inflammatory or granulomatous process given the patient's age. The largest nodule size is 5 mm. Visualized cardiac structures are within normal limits for size. No pericardial effusion. Unremarkable distal thoracic esophagus. Abdomen: Normal CT appearance of the stomach, duodenum, spleen, adrenal glands and pancreas. Normal hepatic contour and morphology. Significant interval improvement in the fluid and gas collection at the inferior margin of the liver which now measures only 2.1 x 1.6 cm. The fluid collection is adjacent to the surgical drainage catheter. Stable position of 1.5 cm appendicolith. Positioned adjacent to the residual fluid collection and drainage catheter. Unremarkable appearance of the bilateral kidneys. No focal solid lesion, hydronephrosis or nephrolithiasis. Surgical changes of prior appendectomy. The surgical blade strain is in good position. There is no residual fluid collection in the right lower quadrant. No focal bowel wall thickening or evidence obstruction. No suspicious adenopathy or new free fluid. Pelvis: Unremarkable bladder, prostate gland and seminal vesicles. No free fluid or suspicious adenopathy. Bones/Soft Tissues:  No acute fracture or  aggressive appearing lytic or blastic osseous lesion. Vascular: No significant atherosclerotic vascular disease, aneurysmal dilatation or acute abnormality. IMPRESSION: 1. Significant interval improvement and perihepatic abscess with a small residual 2.1 x 1.6 cm peripherally enhancing fluid collection adjacent to the well-positioned drainage catheter and migrated appendicolith. 2. Surgical changes of appendectomy with a blade drain in the right lower quadrant operative bed. No evidence of residual fluid collection in this region. Signed, Sterling Big, MD [facility] Electronically Signed   By: Malachy Moan M.D.   On: 01/08/2015 10:57    Labs:  CBC:  Recent Labs  12/23/14 0456 12/24/14 1044 12/25/14 0638 12/27/14 0700  WBC 13.8* 15.2* 18.1* 10.2  HGB 13.2 14.5 14.2 13.0  HCT 39.5 42.3 41.9 39.6  PLT 437* 455* 460* 456*    COAGS:  Recent Labs  12/20/14 0225  INR 1.15  APTT 30    BMP:  Recent Labs  12/18/14 0308 12/20/14 0225 12/22/14 0454 12/27/14 0700  NA 134* 134* 134* 134*  K 3.8 4.1 4.5 5.0  CL 102 98* 100* 96*  CO2 22 27 27 30   GLUCOSE 117* 99 94 103*  BUN 9 7 9 6   CALCIUM 8.9 8.9 9.2 9.1  CREATININE 0.73 0.74 0.76 0.79  GFRNONAA >60 >60 >60 >60  GFRAA >60 >60 >60 >60    LIVER FUNCTION TESTS:  Recent Labs  12/13/14 0024 12/22/14 0454  BILITOT 0.9 0.3  AST 23 51*  ALT 40 125*  ALKPHOS 59 80  PROT 7.8 7.3  ALBUMIN 4.6 3.5    TUMOR MARKERS: No results for input(s): AFPTM, CEA, CA199, CHROMGRNA in the last 8760 hours.  Assessment and Plan:  CT today demonstrates complete resolution of the inferior right hepatic/perihepatic abscess. Focal adjacent calcific density likely represents a focal retained appendicolith. Given lack of significant drain output, the percutaneous drain was removed today without difficulty. The patient has a follow-up appointment with Dr. Janee Morn  tomorrow.  SignedIrish Lack T 01/29/2015, 12:51 PM   I spent a total of 15 Minutes in face to face in clinical consultation, greater than 50% of which was counseling/coordinating care post abscess drainage.

## 2015-02-02 ENCOUNTER — Emergency Department (HOSPITAL_COMMUNITY): Payer: Self-pay

## 2015-02-02 ENCOUNTER — Inpatient Hospital Stay (HOSPITAL_COMMUNITY)
Admission: EM | Admit: 2015-02-02 | Discharge: 2015-02-08 | DRG: 856 | Disposition: A | Discharge status: Home / Self Care | Payer: Self-pay | Attending: Surgery | Admitting: Surgery

## 2015-02-02 ENCOUNTER — Encounter (HOSPITAL_COMMUNITY): Payer: Self-pay

## 2015-02-02 DIAGNOSIS — K381 Appendicular concretions: Secondary | ICD-10-CM | POA: Diagnosis present

## 2015-02-02 DIAGNOSIS — Z6830 Body mass index (BMI) 30.0-30.9, adult: Secondary | ICD-10-CM

## 2015-02-02 DIAGNOSIS — IMO0001 Reserved for inherently not codable concepts without codable children: Secondary | ICD-10-CM

## 2015-02-02 DIAGNOSIS — T814XXA Infection following a procedure, initial encounter: Principal | ICD-10-CM | POA: Diagnosis present

## 2015-02-02 DIAGNOSIS — Z9049 Acquired absence of other specified parts of digestive tract: Secondary | ICD-10-CM

## 2015-02-02 DIAGNOSIS — T8149XA Infection following a procedure, other surgical site, initial encounter: Secondary | ICD-10-CM

## 2015-02-02 DIAGNOSIS — K651 Peritoneal abscess: Secondary | ICD-10-CM | POA: Diagnosis present

## 2015-02-02 DIAGNOSIS — T814XXS Infection following a procedure, sequela: Secondary | ICD-10-CM

## 2015-02-02 DIAGNOSIS — Z23 Encounter for immunization: Secondary | ICD-10-CM

## 2015-02-02 DIAGNOSIS — Z87442 Personal history of urinary calculi: Secondary | ICD-10-CM

## 2015-02-02 DIAGNOSIS — K660 Peritoneal adhesions (postprocedural) (postinfection): Secondary | ICD-10-CM | POA: Diagnosis present

## 2015-02-02 DIAGNOSIS — T8143XA Infection following a procedure, organ and space surgical site, initial encounter: Secondary | ICD-10-CM | POA: Diagnosis present

## 2015-02-02 HISTORY — DX: Calculus of kidney: N20.0

## 2015-02-02 LAB — CBC WITH DIFFERENTIAL/PLATELET
BASOS ABS: 0 10*3/uL (ref 0.0–0.1)
BASOS PCT: 0 %
Eosinophils Absolute: 0.1 10*3/uL (ref 0.0–0.7)
Eosinophils Relative: 1 %
HEMATOCRIT: 40.4 % (ref 39.0–52.0)
HEMOGLOBIN: 14.1 g/dL (ref 13.0–17.0)
Lymphocytes Relative: 16 %
Lymphs Abs: 1.7 10*3/uL (ref 0.7–4.0)
MCH: 26.8 pg (ref 26.0–34.0)
MCHC: 34.9 g/dL (ref 30.0–36.0)
MCV: 76.7 fL — ABNORMAL LOW (ref 78.0–100.0)
Monocytes Absolute: 1.2 10*3/uL — ABNORMAL HIGH (ref 0.1–1.0)
Monocytes Relative: 10 %
NEUTROS ABS: 8.1 10*3/uL — AB (ref 1.7–7.7)
NEUTROS PCT: 73 %
Platelets: 241 10*3/uL (ref 150–400)
RBC: 5.27 MIL/uL (ref 4.22–5.81)
RDW: 12.6 % (ref 11.5–15.5)
WBC: 11.1 10*3/uL — ABNORMAL HIGH (ref 4.0–10.5)

## 2015-02-02 LAB — COMPREHENSIVE METABOLIC PANEL
ALK PHOS: 97 U/L (ref 38–126)
ALT: 24 U/L (ref 17–63)
ANION GAP: 12 (ref 5–15)
AST: 23 U/L (ref 15–41)
Albumin: 3.8 g/dL (ref 3.5–5.0)
BILIRUBIN TOTAL: 1.1 mg/dL (ref 0.3–1.2)
BUN: 6 mg/dL (ref 6–20)
CALCIUM: 9.2 mg/dL (ref 8.9–10.3)
CHLORIDE: 95 mmol/L — AB (ref 101–111)
CO2: 26 mmol/L (ref 22–32)
CREATININE: 0.71 mg/dL (ref 0.61–1.24)
GFR calc non Af Amer: >60 mL/min (ref >60)
GLUCOSE: 118 mg/dL — AB (ref 65–99)
Potassium: 4 mmol/L (ref 3.5–5.1)
Sodium: 133 mmol/L — ABNORMAL LOW (ref 135–145)
Total Protein: 7.9 g/dL (ref 6.5–8.1)

## 2015-02-02 LAB — URINALYSIS, ROUTINE W REFLEX MICROSCOPIC
GLUCOSE, UA: NEGATIVE mg/dL
KETONES UR: 15 mg/dL — AB
LEUKOCYTES UA: NEGATIVE
NITRITE: POSITIVE — AB
PH: 6.5 (ref 5.0–8.0)
Protein, ur: NEGATIVE mg/dL
SPECIFIC GRAVITY, URINE: 1.015 (ref 1.005–1.030)

## 2015-02-02 LAB — I-STAT CG4 LACTIC ACID, ED: Lactic Acid, Venous: 1.33 mmol/L (ref 0.5–2.0)

## 2015-02-02 LAB — URINE MICROSCOPIC-ADD ON

## 2015-02-02 MED ORDER — IOHEXOL 300 MG/ML  SOLN
100.0000 mL | Freq: Once | INTRAMUSCULAR | Status: AC | PRN
  Administered 2015-02-02: 100 mL via INTRAVENOUS

## 2015-02-02 MED ORDER — PIPERACILLIN-TAZOBACTAM 3.375 G IVPB
3.3750 g | Freq: Three times a day (TID) | INTRAVENOUS | Status: DC
  Administered 2015-02-02 – 2015-02-08 (×17): 3.375 g via INTRAVENOUS
  Filled 2015-02-02 (×21): qty 50

## 2015-02-02 MED ORDER — ONDANSETRON HCL 4 MG/2ML IJ SOLN
4.0000 mg | Freq: Once | INTRAMUSCULAR | Status: AC
  Administered 2015-02-02: 4 mg via INTRAVENOUS
  Filled 2015-02-02: qty 2

## 2015-02-02 MED ORDER — SIMETHICONE 80 MG PO CHEW: 40.0000 mg | CHEWABLE_TABLET | Freq: Four times a day (QID) | ORAL | Status: DC | PRN

## 2015-02-02 MED ORDER — ENOXAPARIN SODIUM 30 MG/0.3ML ~~LOC~~ SOLN
30.0000 mg | SUBCUTANEOUS | Status: DC
  Administered 2015-02-02 – 2015-02-03 (×2): 30 mg via SUBCUTANEOUS
  Filled 2015-02-02: qty 0.3

## 2015-02-02 MED ORDER — ONDANSETRON HCL 4 MG/2ML IJ SOLN: 4.0000 mg | Freq: Four times a day (QID) | INTRAMUSCULAR | Status: DC | PRN

## 2015-02-02 MED ORDER — MORPHINE SULFATE (PF) 4 MG/ML IV SOLN
4.0000 mg | Freq: Once | INTRAVENOUS | Status: AC
  Administered 2015-02-02: 4 mg via INTRAVENOUS
  Filled 2015-02-02: qty 1

## 2015-02-02 MED ORDER — INFLUENZA VAC SPLIT QUAD 0.5 ML IM SUSY: 0.5000 mL | PREFILLED_SYRINGE | INTRAMUSCULAR | Status: DC

## 2015-02-02 MED ORDER — ONDANSETRON 4 MG PO TBDP: 4.0000 mg | ORAL_TABLET | Freq: Four times a day (QID) | ORAL | Status: DC | PRN

## 2015-02-02 MED ORDER — OXYCODONE HCL 5 MG PO TABS
5.0000 mg | ORAL_TABLET | ORAL | Status: DC | PRN
  Administered 2015-02-02 – 2015-02-06 (×5): 10 mg via ORAL
  Administered 2015-02-06: 5 mg via ORAL
  Administered 2015-02-06 – 2015-02-08 (×4): 10 mg via ORAL
  Filled 2015-02-02 (×2): qty 1
  Filled 2015-02-02 (×9): qty 2

## 2015-02-02 MED ORDER — SACCHAROMYCES BOULARDII 250 MG PO CAPS
250.0000 mg | ORAL_CAPSULE | Freq: Two times a day (BID) | ORAL | Status: DC
  Administered 2015-02-02 – 2015-02-08 (×11): 250 mg via ORAL
  Filled 2015-02-02 (×11): qty 1

## 2015-02-02 MED ORDER — SODIUM CHLORIDE 0.9 % IV BOLUS (SEPSIS)
1000.0000 mL | Freq: Once | INTRAVENOUS | Status: AC
  Administered 2015-02-02: 1000 mL via INTRAVENOUS

## 2015-02-02 MED ORDER — HYDROMORPHONE HCL 1 MG/ML IJ SOLN
1.0000 mg | INTRAMUSCULAR | Status: DC | PRN
  Administered 2015-02-03 – 2015-02-05 (×5): 1 mg via INTRAVENOUS
  Filled 2015-02-02 (×5): qty 1

## 2015-02-02 MED ORDER — PIPERACILLIN-TAZOBACTAM 3.375 G IVPB 30 MIN
3.3750 g | Freq: Once | INTRAVENOUS | Status: AC
  Administered 2015-02-02: 3.375 g via INTRAVENOUS
  Filled 2015-02-02: qty 50

## 2015-02-02 MED ORDER — KCL IN DEXTROSE-NACL 20-5-0.9 MEQ/L-%-% IV SOLN
INTRAVENOUS | Status: DC
  Administered 2015-02-02 – 2015-02-04 (×3): via INTRAVENOUS
  Administered 2015-02-04: 1 mL via INTRAVENOUS
  Administered 2015-02-05 – 2015-02-08 (×7): via INTRAVENOUS
  Filled 2015-02-02 (×18): qty 1000

## 2015-02-02 NOTE — ED Provider Notes (Signed)
CSN: [id number]     Arrival date & time 02/02/15  1004 History   First MD Initiated Contact with Patient 02/02/15 1007     Chief Complaint  Patient presents with  . Abdominal Pain   (Consider location/radiation/quality/duration/timing/severity/associated sxs/prior Treatment) The history is provided by the patient. A language interpreter was used.   Mr. Thornton Papasbarca is a 30 year old male with a history of recent appendectomy on 12/13/2014 who presents for constant and worsening right flank pain for the past 3 days. He reports 3 days of nausea. His last bowel movement was day before yesterday. He is able to pass gas. He has been taking pain medication since surgery. He also reports fevers but has not taken his temperature. He also reports dark colored urine but no urinary frequency. He last had his pain medication at 9:00 last night. He stated that after his appendectomy he developed an infection. He had a drain placed which was removed a couple of weeks ago.  He denies any chills, chest pain, shortness of breath, vomiting, diarrhea, dysuria.  Past Medical History  Diagnosis Date  . Kidney stones    Past Surgical History  Procedure Laterality Date  . Appendectomy    . Laparoscopic appendectomy N/A 12/13/2014    Procedure: APPENDECTOMY LAPAROSCOPIC FOR RUPTURED APPENDIX; LAPAROSCOPIC LYSIS OF ADHESIONS 45 minutes;  Surgeon: Violeta GelinasBurke Thompson, MD;  [facility];  Service: General;  Laterality: N/A;   History reviewed. No pertinent family history. Social History  Substance Use Topics  . Smoking status: Never Smoker   . Smokeless tobacco: None  . Alcohol Use: Yes     Comment: sometimes    Review of Systems  Constitutional: Positive for fever.  Gastrointestinal: Positive for nausea and abdominal pain. Negative for vomiting and diarrhea.  All other systems reviewed and are negative.     Allergies  Review of patient's allergies indicates not on file.  Home Medications   Prior to  Admission medications   Medication Sig Start Date End Date Taking? Authorizing Provider  ciprofloxacin (CIPRO) 500 MG tablet Take 500 mg by mouth 2 (two) times daily.   Yes Historical Provider, MD  oxyCODONE-acetaminophen (PERCOCET/ROXICET) 5-325 MG tablet Take 1-2 tablets by mouth every 4 (four) hours as needed for moderate pain or severe pain. 12/28/14  Yes [name] Riebock, NP  cefpodoxime (VANTIN) 200 MG tablet Take 2 tablets (400 mg total) by mouth 2 (two) times daily. Patient not taking: Reported on 02/02/2015 12/28/14   Ashok NorrisEmina Riebock, NP  saccharomyces boulardii (FLORASTOR) 250 MG capsule Take 1 capsule (250 mg total) by mouth 2 (two) times daily. Patient not taking: Reported on 02/02/2015 12/28/14   [name] Riebock, NP   BP 110/67 mmHg  Pulse 107  Temp(Src) 98.9 F (37.2 C) (Oral)  Resp 18  Ht 5\' 7"  (1.702 m)  Wt 87.408 kg  BMI 30.17 kg/m2  SpO2 98% Physical Exam  Constitutional: He is oriented to person, place, and time. He appears well-developed and well-nourished.  HENT:  Head: Normocephalic and atraumatic.  Eyes: Conjunctivae are normal.  Neck: Normal range of motion. Neck supple.  Cardiovascular: Normal rate, regular rhythm and normal heart sounds.   Regular rate and rhythm. No murmur.  Pulmonary/Chest: Effort normal and breath sounds normal.  Lungs clear to auscultation bilaterally.  Abdominal: Soft. He exhibits no distension. There is no tenderness.    Significant abdominal tenderness to palpation on exam. No guarding or rebound. No abdominal distention.  Right flank tenderness to Lloyd's punch. He has a healing  catheter wound from a drainage tube that was placed after appendectomy.  Musculoskeletal: Normal range of motion.  Neurological: He is alert and oriented to person, place, and time.  Skin: Skin is warm and dry.  Nursing note and vitals reviewed.   ED Course  Procedures (including critical care time) Labs Review Labs Reviewed  CBC WITH DIFFERENTIAL/PLATELET -  Abnormal; Notable for the following:    WBC 11.1 (*)    MCV 76.7 (*)    Neutro Abs 8.1 (*)    Monocytes Absolute 1.2 (*)    All other components within normal limits  COMPREHENSIVE METABOLIC PANEL - Abnormal; Notable for the following:    Sodium 133 (*)    Chloride 95 (*)    Glucose, Bld 118 (*)    All other components within normal limits  URINALYSIS, ROUTINE W REFLEX MICROSCOPIC (NOT AT [facility]) - Abnormal; Notable for the following:    Color, Urine AMBER (*)    Hgb urine dipstick SMALL (*)    Bilirubin Urine SMALL (*)    Ketones, ur 15 (*)    Nitrite POSITIVE (*)    All other components within normal limits  URINE MICROSCOPIC-ADD ON - Abnormal; Notable for the following:    Squamous Epithelial / LPF 0-5 (*)    Bacteria, UA RARE (*)    All other components within normal limits  CULTURE, BLOOD (ROUTINE X 2)  CULTURE, BLOOD (ROUTINE X 2)  CBC  COMPREHENSIVE METABOLIC PANEL  I-STAT CG4 LACTIC ACID, ED    Imaging Review Ct Abdomen Pelvis W Contrast  02/02/2015  CLINICAL DATA:  Perforated appendicitis in November 2016 complicated by right upper quadrant abscess requiring percutaneous drainage, with removal of drain on 01/29/2015. Recurrent right flank pain. EXAM: CT ABDOMEN AND PELVIS WITH CONTRAST TECHNIQUE: Multidetector CT imaging of the abdomen and pelvis was performed using the standard protocol following bolus administration of intravenous contrast. CONTRAST:  OMNIPAQUE IOHEXOL 300 MG/ML  SOLN COMPARISON:  01/29/2015 CT abdomen/ pelvis. FINDINGS: Lower chest: Anterior subpleural right middle lobe 3 mm pulmonary nodule (series 3/ image 11), stable since 12/13/2014. Subpleural 5 mm plaque associated with the minor fissure (series 3/image 4), stable since 12/13/2014. Left lower lobe 4 mm pulmonary nodule (series 3/image 14), stable since 12/13/2014. New trace right pleural effusion with bilateral lower lobe atelectasis, right greater than left. Hepatobiliary: There is a recurrent  thick walled 7.2 x 3.9 x 5.7 cm irregular gas and fluid containing abscess (series 2/ image 41) in the right posterior inferior perihepatic space surrounding the 15 mm appendicolith, which demonstrates mass effect on segment 6 of the right liver lobe. There is new prominent fat stranding surrounding the abscess. Otherwise normal liver. Normal gallbladder with no radiopaque cholelithiasis. No biliary ductal dilatation. Pancreas: Normal, with no mass or duct dilation. Spleen: Normal size. No mass. Adrenals/Urinary Tract: Normal adrenals. Normal kidneys with no hydronephrosis and no renal mass. Normal bladder. Stomach/Bowel: Grossly normal stomach. Normal caliber small bowel with no small bowel wall thickening. The patient is status post appendectomy. There is in increased small amount of free gas in the right lower quadrant adjacent to the appendectomy site (series 2/ image 60). There is an additional tiny 1.6 x 0.9 cm thick-walled collection in the medial right mesentery anterior to the right common iliac artery (series 2/ image 71), which surrounds a tiny 4 mm calcification that could represent an additional appendicolith fragment. Normal large bowel with no diverticulosis, large bowel wall thickening or pericolonic fat stranding. Vascular/Lymphatic: Normal  caliber abdominal aorta. Patent portal, splenic, hepatic and renal veins. No pathologically enlarged lymph nodes in the abdomen or pelvis. Reproductive: Normal size prostate and seminal vesicles. Nonspecific faint internal prostatic calcifications, unchanged. Other: No ascites . Musculoskeletal: No aggressive appearing focal osseous lesions. IMPRESSION: 1. Recurrent 7.2 cm abscess in the right posterior inferior perihepatic space surrounding a 15 mm appendicolith. 2. Small amount of increased extraluminal gas in the right lower quadrant adjacent to the appendectomy site, cannot exclude a dehiscence at the appendectomy site. 3. Additional tiny 1.6 cm thick-walled  collection in the medial right mesentery surrounding a tiny 4 mm calcification, suggestive of an additional appendicolith fragment with surrounding infection. 4. New trace right pleural effusion. 5. Tiny pulmonary nodules at the lung bases, largest 4 mm in the left lower lobe, for which 1 month stability has been demonstrated. If the patient is at high risk for bronchogenic carcinoma, follow-up chest CT at 1 year is recommended. If the patient is at low risk, no follow-up is needed. This recommendation follows the consensus statement: Guidelines for Management of Small Pulmonary Nodules Detected on CT Scans: A Statement from the Fleischner Society as published in Radiology 2005; 237:395-400. Electronically Signed   By: Delbert Phenix M.D.   On: 02/02/2015 12:21   I have personally reviewed and evaluated these lab results as part of my medical decision-making.   EKG Interpretation None      MDM   Final diagnoses:  Postoperative intra-abdominal abscess, initial encounter [facility])  Patient presents for abdominal and right flank pain for the past 3 days with nausea.  He had a history of a ruptured appendicitis status post appendectomy done by Dr. Janee Morn on 12/13/14. After the surgery he developed an intra-abdominal abscess with a migrated appendicolith. A percutaneous drainage catheter was placed under CT guidance on 12/26/2014 by IR.  CT on 01/08/15 showed minimal adjacent fluid collection. CT on 01/28/14 to complete resolution of the inferior right hepatic and perihepatic abscess. He had minimal drainage and the percutaneous drain was removed. She was scheduled to have follow-up appointment on 01/29/2014 with Dr. Janee Morn.  She last had pain medication at 9 PM yesterday. He is afebrile but tachycardic. Lactic acid and blood cultures were ordered. I discussed this patient with general surgery on call who stated that he would need a repeat CT scan.  Discussed this patient with Jaynie Crumble, PA-C  regarding pending labs and pending abdominal CT. Patient may have recurrent abscess.  Catha Gosselin, PA-C 02/02/15 2136  Rolan Bucco, MD 02/05/15 [phone]

## 2015-02-02 NOTE — ED Notes (Signed)
Patient undressed, in gown, on continuous pulse oximetry and blood pressure cuff 

## 2015-02-02 NOTE — ED Provider Notes (Signed)
Patient signed out to me at shift change. Patient post appendectomy on 11/15/thousand 16. Intra-abdominal abscess with drain placement on 12/29/2014. Patient is coming back with worsening abdominal pain. Labs and CT abdomen and pelvis pending.  Results for orders placed or performed during the hospital encounter of 02/02/15  CBC with Differential  Result Value Ref Range   WBC 11.1 (H) 4.0 - 10.5 K/uL   RBC 5.27 4.22 - 5.81 MIL/uL   Hemoglobin 14.1 13.0 - 17.0 g/dL   HCT 72.540.4 36.639.0 - 44.052.0 %   MCV 76.7 (L) 78.0 - 100.0 fL   MCH 26.8 26.0 - 34.0 pg   MCHC 34.9 30.0 - 36.0 g/dL   RDW 34.712.6 42.511.5 - 95.615.5 %   Platelets 241 150 - 400 K/uL   Neutrophils Relative % 73 %   Neutro Abs 8.1 (H) 1.7 - 7.7 K/uL   Lymphocytes Relative 16 %   Lymphs Abs 1.7 0.7 - 4.0 K/uL   Monocytes Relative 10 %   Monocytes Absolute 1.2 (H) 0.1 - 1.0 K/uL   Eosinophils Relative 1 %   Eosinophils Absolute 0.1 0.0 - 0.7 K/uL   Basophils Relative 0 %   Basophils Absolute 0.0 0.0 - 0.1 K/uL  Comprehensive metabolic panel  Result Value Ref Range   Sodium 133 (L) 135 - 145 mmol/L   Potassium 4.0 3.5 - 5.1 mmol/L   Chloride 95 (L) 101 - 111 mmol/L   CO2 26 22 - 32 mmol/L   Glucose, Bld 118 (H) 65 - 99 mg/dL   BUN 6 6 - 20 mg/dL   Creatinine, Ser 3.870.71 0.61 - 1.24 mg/dL   Calcium 9.2 8.9 - 56.410.3 mg/dL   Total Protein 7.9 6.5 - 8.1 g/dL   Albumin 3.8 3.5 - 5.0 g/dL   AST 23 15 - 41 U/L   ALT 24 17 - 63 U/L   Alkaline Phosphatase 97 38 - 126 U/L   Total Bilirubin 1.1 0.3 - 1.2 mg/dL   GFR calc non Af Amer >60 >60 mL/min   GFR calc Af Amer >60 >60 mL/min   Anion gap 12 5 - 15  I-Stat CG4 Lactic Acid, ED  Result Value Ref Range   Lactic Acid, Venous 1.33 0.5 - 2.0 mmol/L   Ct Abdomen W Contrast  01/29/2015  CLINICAL DATA:  Perforated appendicitis status post laparoscopic appendectomy on 12/13/2014, complicated by perihepatic abscess requiring percutaneous drainage, presenting for follow-up. EXAM: CT ABDOMEN WITH  CONTRAST TECHNIQUE: Multidetector CT imaging of the abdomen was performed using the standard protocol following bolus administration of intravenous contrast. CONTRAST:  100mL ISOVUE-300 IOPAMIDOL (ISOVUE-300) INJECTION 61% COMPARISON:  01/08/2015 CT abdomen/ pelvis. FINDINGS: Lower chest: Anterior right middle lobe subpleural 3 mm pulmonary nodule (series 5/image 4) is stable since 12/13/2014. Two tiny left lower lobe solid pulmonary nodules, largest 4 mm (series 5/image 80), stable since 12/13/2014. Hepatobiliary: Normal liver with no liver mass. Normal gallbladder with no radiopaque cholelithiasis. No biliary ductal dilatation. Pancreas: Normal, with no mass or duct dilation. Spleen: Normal size. No mass. Adrenals/Urinary Tract: Normal adrenals. A punctate 1 mm density in the anterior lower right renal collecting system could represent a tiny nonobstructing stone versus early contrast excretion. Otherwise normal kidneys, with no renal mass and no hydronephrosis. Stomach/Bowel: Grossly normal stomach. Visualized small and large bowel is normal caliber, with no bowel wall thickening. The patient is status post appendectomy. There has been interval removal of the surgical drain at the appendectomy bed. No recurrent fluid collection at  the appendectomy bed. Vascular/Lymphatic: Normal caliber abdominal aorta. Patent portal, splenic, hepatic and renal veins. No pathologically enlarged lymph nodes in the abdomen. Other: Lateral right upper abdominal approach transhepatic percutaneous pigtail drainage catheter terminates in the posterior inferior right perihepatic space. No residual or new fluid collection is seen in the perihepatic space. There is a persistent calcified 14 mm appendicolith in the posterior inferior right perihepatic space adjacent to the pigtail drainage catheter tip. No pneumoperitoneum or new intra-abdominal fluid collections. Ascites. Musculoskeletal: No aggressive appearing focal osseous lesions.  IMPRESSION: 1. No residual perihepatic fluid collection. Persistent appendicolith adjacent to the pigtail drainage catheter in the posterior inferior perihepatic space. 2. Interval removal of the surgical drain from the appendectomy bed, with no fluid collection in the surgical bed. 3. Tiny pulmonary nodules at the lung bases, largest 4 mm, for which 1 month stability has been demonstrated. If the patient is at high risk for bronchogenic carcinoma, follow-up chest CT at 1 year is recommended. If the patient is at low risk, no follow-up is needed. This recommendation follows the consensus statement: Guidelines for Management of Small Pulmonary Nodules Detected on CT Scans: A Statement from the Fle[employer] Society as published in Radiology 2005; 237:395-400. Electronically Signed   By: Delbert Phenix M.D.   On: 01/29/2015 10:17   Ct Abdomen Pelvis W Contrast  02/02/2015  CLINICAL DATA:  Perforated appendicitis in November 2016 complicated by right upper quadrant abscess requiring percutaneous drainage, with removal of drain on 01/29/2015. Recurrent right flank pain. EXAM: CT ABDOMEN AND PELVIS WITH CONTRAST TECHNIQUE: Multidetector CT imaging of the abdomen and pelvis was performed using the standard protocol following bolus administration of intravenous contrast. CONTRAST:  OMNIPAQUE IOHEXOL 300 MG/ML  SOLN COMPARISON:  01/29/2015 CT abdomen/ pelvis. FINDINGS: Lower chest: Anterior subpleural right middle lobe 3 mm pulmonary nodule (series 3/ image 11), stable since 12/13/2014. Subpleural 5 mm plaque associated with the minor fissure (series 3/image 4), stable since 12/13/2014. Left lower lobe 4 mm pulmonary nodule (series 3/image 14), stable since 12/13/2014. New trace right pleural effusion with bilateral lower lobe atelectasis, right greater than left. Hepatobiliary: There is a recurrent thick walled 7.2 x 3.9 x 5.7 cm irregular gas and fluid containing abscess (series 2/ image 41) in the right posterior  inferior perihepatic space surrounding the 15 mm appendicolith, which demonstrates mass effect on segment 6 of the right liver lobe. There is new prominent fat stranding surrounding the abscess. Otherwise normal liver. Normal gallbladder with no radiopaque cholelithiasis. No biliary ductal dilatation. Pancreas: Normal, with no mass or duct dilation. Spleen: Normal size. No mass. Adrenals/Urinary Tract: Normal adrenals. Normal kidneys with no hydronephrosis and no renal mass. Normal bladder. Stomach/Bowel: Grossly normal stomach. Normal caliber small bowel with no small bowel wall thickening. The patient is status post appendectomy. There is in increased small amount of free gas in the right lower quadrant adjacent to the appendectomy site (series 2/ image 60). There is an additional tiny 1.6 x 0.9 cm thick-walled collection in the medial right mesentery anterior to the right common iliac artery (series 2/ image 71), which surrounds a tiny 4 mm calcification that could represent an additional appendicolith fragment. Normal large bowel with no diverticulosis, large bowel wall thickening or pericolonic fat stranding. Vascular/Lymphatic: Normal caliber abdominal aorta. Patent portal, splenic, hepatic and renal veins. No pathologically enlarged lymph nodes in the abdomen or pelvis. Reproductive: Normal size prostate and seminal vesicles. Nonspecific faint internal prostatic calcifications, unchanged. Other: No ascites . Musculoskeletal:  No aggressive appearing focal osseous lesions. IMPRESSION: 1. Recurrent 7.2 cm abscess in the right posterior inferior perihepatic space surrounding a 15 mm appendicolith. 2. Small amount of increased extraluminal gas in the right lower quadrant adjacent to the appendectomy site, cannot exclude a dehiscence at the appendectomy site. 3. Additional tiny 1.6 cm thick-walled collection in the medial right mesentery surrounding a tiny 4 mm calcification, suggestive of an additional  appendicolith fragment with surrounding infection. 4. New trace right pleural effusion. 5. Tiny pulmonary nodules at the lung bases, largest 4 mm in the left lower lobe, for which 1 month stability has been demonstrated. If the patient is at high risk for bronchogenic carcinoma, follow-up chest CT at 1 year is recommended. If the patient is at low risk, no follow-up is needed. This recommendation follows the consensus statement: Guidelines for Management of Small Pulmonary Nodules Detected on CT Scans: A Statement from the Fle[employer]ner Society as published in Radiology 2005; 237:395-400. Electronically Signed   By: Delbert Phenix M.D.   On: 02/02/2015 12:21   Ct Abdomen Pelvis W Contrast  01/08/2015  CLINICAL DATA:  30 year old male with a history of acute appendicitis complicated by perihepatic post appendectomy abscess with an associated migrated appendicolith. He underwent percutaneous drain placement on 11/30 2016. EXAM: CT ABDOMEN AND PELVIS WITH CONTRAST TECHNIQUE: Multidetector CT imaging of the abdomen and pelvis was performed using the standard protocol following bolus administration of intravenous contrast. CONTRAST:  ISOVUE-300 IOPAMIDOL (ISOVUE-300) INJECTION 61% COMPARISON:  Prior CT abdomen/ pelvis 12/25/2014 FINDINGS: Lower Chest: Mild dependent atelectasis in the right lower lobe. Stable small left lower lobe pulmonary nodules which are almost certainly the sequelae of a a prior infectious/ inflammatory or granulomatous process given the patient's age. The largest nodule size is 5 mm. Visualized cardiac structures are within normal limits for size. No pericardial effusion. Unremarkable distal thoracic esophagus. Abdomen: Normal CT appearance of the stomach, duodenum, spleen, adrenal glands and pancreas. Normal hepatic contour and morphology. Significant interval improvement in the fluid and gas collection at the inferior margin of the liver which now measures only 2.1 x 1.6 cm. The fluid  collection is adjacent to the surgical drainage catheter. Stable position of 1.5 cm appendicolith. Positioned adjacent to the residual fluid collection and drainage catheter. Unremarkable appearance of the bilateral kidneys. No focal solid lesion, hydronephrosis or nephrolithiasis. Surgical changes of prior appendectomy. The surgical blade strain is in good position. There is no residual fluid collection in the right lower quadrant. No focal bowel wall thickening or evidence obstruction. No suspicious adenopathy or new free fluid. Pelvis: Unremarkable bladder, prostate gland and seminal vesicles. No free fluid or suspicious adenopathy. Bones/Soft Tissues: No acute fracture or aggressive appearing lytic or blastic osseous lesion. Vascular: No significant atherosclerotic vascular disease, aneurysmal dilatation or acute abnormality. IMPRESSION: 1. Significant interval improvement and perihepatic abscess with a small residual 2.1 x 1.6 cm peripherally enhancing fluid collection adjacent to the well-positioned drainage catheter and migrated appendicolith. 2. Surgical changes of appendectomy with a blade drain in the right lower quadrant operative bed. No evidence of residual fluid collection in this region. Signed, Sterling Big, MD [facility] Electronically Signed   By: Malachy Moan M.D.   On: 01/08/2015 10:57    12:42 PM CT with findings as described above. I spoke with general surgery, they will come by and see patient. Used translator phone and discussed results with patient, all questions answered. Patient states his pain is  only 1 out of 10 at this time. He still mildly tachycardic, however heart rate improved, now 105 on the monitor. Patient is in no acute distress at this time.  Jaynie Crumble, PA-C 02/02/15 1505  Rolan Bucco, MD 02/02/15 1510

## 2015-02-02 NOTE — ED Notes (Addendum)
Pt states Appendix removed November 15 with drain left at right side until Dec 3. Drain then removed but pain continues. Denies pain with urination but "red" urination. Co nausea burt denies vomiting or diarrhea.

## 2015-02-02 NOTE — ED Notes (Signed)
Pt returns from ct  

## 2015-02-02 NOTE — H&P (Signed)
Dale Crane is an 30 y.o. male.   Chief Complaint: Abdominal pain HPI: Patient presents the emergency room with a 2 day history of abdominal pain. His history is significant for appendectomy and November 2016 for perforated appendix. His course, caliber by postoperative abscess along the right colon and right infrahepatic space treated with a percutaneous drain the last 7 weeks. He had his drain removed 4 days ago. He been developed more pain along his right abdomen without fever or chills. CT scan was obtained which showed recurrence of the abscess with a fecalith it's been present since November 2016. He has no fever or chills. I'm able to communicate with him via the help of the hospital translator. No nausea or vomiting.  Past Medical History  Diagnosis Date  . Kidney stones     Past Surgical History  Procedure Laterality Date  . Appendectomy    . Laparoscopic appendectomy N/A 12/13/2014    Procedure: APPENDECTOMY LAPAROSCOPIC FOR RUPTURED APPENDIX; LAPAROSCOPIC LYSIS OF ADHESIONS 45 minutes;  Surgeon: Georganna Skeans, MD;  Location: [place];  Service: General;  Laterality: N/A;    History reviewed. No pertinent family history. Social History:  reports that he has never smoked. He does not have any smokeless tobacco history on file. He reports that he drinks alcohol. He reports that he does not use illicit drugs.  Allergies: Not on File  Medications Prior to Admission  Medication Sig Dispense Refill  . ciprofloxacin (CIPRO) 500 MG tablet Take 500 mg by mouth 2 (two) times daily.    Marland Kitchen oxyCODONE-acetaminophen (PERCOCET/ROXICET) 5-325 MG tablet Take 1-2 tablets by mouth every 4 (four) hours as needed for moderate pain or severe pain. 50 tablet 0  . cefpodoxime (VANTIN) 200 MG tablet Take 2 tablets (400 mg total) by mouth 2 (two) times daily. (Patient not taking: Reported on 02/02/2015) 56 tablet 0  . saccharomyces boulardii (FLORASTOR) 250 MG capsule Take 1 capsule (250 mg total) by  mouth 2 (two) times daily. (Patient not taking: Reported on 02/02/2015) 60 capsule 0    Results for orders placed or performed during the hospital encounter of 02/02/15 (from the past 48 hour(s))  CBC with Differential     Status: Abnormal   Collection Time: 02/02/15 10:40 AM  Result Value Ref Range   WBC 11.1 (H) 4.0 - 10.5 K/uL   RBC 5.27 4.22 - 5.81 MIL/uL   Hemoglobin 14.1 13.0 - 17.0 g/dL   HCT 40.4 39.0 - 52.0 %   MCV 76.7 (L) 78.0 - 100.0 fL   MCH 26.8 26.0 - 34.0 pg   MCHC 34.9 30.0 - 36.0 g/dL   RDW 12.6 11.5 - 15.5 %   Platelets 241 150 - 400 K/uL   Neutrophils Relative % 73 %   Neutro Abs 8.1 (H) 1.7 - 7.7 K/uL   Lymphocytes Relative 16 %   Lymphs Abs 1.7 0.7 - 4.0 K/uL   Monocytes Relative 10 %   Monocytes Absolute 1.2 (H) 0.1 - 1.0 K/uL   Eosinophils Relative 1 %   Eosinophils Absolute 0.1 0.0 - 0.7 K/uL   Basophils Relative 0 %   Basophils Absolute 0.0 0.0 - 0.1 K/uL  Comprehensive metabolic panel     Status: Abnormal   Collection Time: 02/02/15 10:40 AM  Result Value Ref Range   Sodium 133 (L) 135 - 145 mmol/L   Potassium 4.0 3.5 - 5.1 mmol/L   Chloride 95 (L) 101 - 111 mmol/L   CO2 26 22 - 32 mmol/L  Glucose, Bld 118 (H) 65 - 99 mg/dL   BUN 6 6 - 20 mg/dL   Creatinine, Ser 0.71 0.61 - 1.24 mg/dL   Calcium 9.2 8.9 - 10.3 mg/dL   Total Protein 7.9 6.5 - 8.1 g/dL   Albumin 3.8 3.5 - 5.0 g/dL   AST 23 15 - 41 U/L   ALT 24 17 - 63 U/L   Alkaline Phosphatase 97 38 - 126 U/L   Total Bilirubin 1.1 0.3 - 1.2 mg/dL   GFR calc non Af Amer >60 >60 mL/min   GFR calc Af Amer >60 >60 mL/min    Comment: (NOTE) The eGFR has been calculated using the CKD EPI equation. This calculation has not been validated in all clinical situations. eGFR's persistently <60 mL/min signify possible Chronic Kidney Disease.    Anion gap 12 5 - 15  I-Stat CG4 Lactic Acid, ED     Status: None   Collection Time: 02/02/15 11:02 AM  Result Value Ref Range   Lactic Acid, Venous 1.33 0.5 -  2.0 mmol/L   Ct Abdomen Pelvis W Contrast  02/02/2015  CLINICAL DATA:  Perforated appendicitis in November 9983 complicated by right upper quadrant abscess requiring percutaneous drainage, with removal of drain on 01/29/2015. Recurrent right flank pain. EXAM: CT ABDOMEN AND PELVIS WITH CONTRAST TECHNIQUE: Multidetector CT imaging of the abdomen and pelvis was performed using the standard protocol following bolus administration of intravenous contrast. CONTRAST:  127m OMNIPAQUE IOHEXOL 300 MG/ML  SOLN COMPARISON:  01/29/2015 CT abdomen/ pelvis. FINDINGS: Lower chest: Anterior subpleural right middle lobe 3 mm pulmonary nodule (series 3/ image 11), stable since 12/13/2014. Subpleural 5 mm plaque associated with the minor fissure (series 3/image 4), stable since 12/13/2014. Left lower lobe 4 mm pulmonary nodule (series 3/image 14), stable since 12/13/2014. New trace right pleural effusion with bilateral lower lobe atelectasis, right greater than left. Hepatobiliary: There is a recurrent thick walled 7.2 x 3.9 x 5.7 cm irregular gas and fluid containing abscess (series 2/ image 41) in the right posterior inferior perihepatic space surrounding the 15 mm appendicolith, which demonstrates mass effect on segment 6 of the right liver lobe. There is new prominent fat stranding surrounding the abscess. Otherwise normal liver. Normal gallbladder with no radiopaque cholelithiasis. No biliary ductal dilatation. Pancreas: Normal, with no mass or duct dilation. Spleen: Normal size. No mass. Adrenals/Urinary Tract: Normal adrenals. Normal kidneys with no hydronephrosis and no renal mass. Normal bladder. Stomach/Bowel: Grossly normal stomach. Normal caliber small bowel with no small bowel wall thickening. The patient is status post appendectomy. There is in increased small amount of free gas in the right lower quadrant adjacent to the appendectomy site (series 2/ image 60). There is an additional tiny 1.6 x 0.9 cm thick-walled  collection in the medial right mesentery anterior to the right common iliac artery (series 2/ image 71), which surrounds a tiny 4 mm calcification that could represent an additional appendicolith fragment. Normal large bowel with no diverticulosis, large bowel wall thickening or pericolonic fat stranding. Vascular/Lymphatic: Normal caliber abdominal aorta. Patent portal, splenic, hepatic and renal veins. No pathologically enlarged lymph nodes in the abdomen or pelvis. Reproductive: Normal size prostate and seminal vesicles. Nonspecific faint internal prostatic calcifications, unchanged. Other: No ascites . Musculoskeletal: No aggressive appearing focal osseous lesions. IMPRESSION: 1. Recurrent 7.2 cm abscess in the right posterior inferior perihepatic space surrounding a 15 mm appendicolith. 2. Small amount of increased extraluminal gas in the right lower quadrant adjacent to the appendectomy site, cannot  exclude a dehiscence at the appendectomy site. 3. Additional tiny 1.6 cm thick-walled collection in the medial right mesentery surrounding a tiny 4 mm calcification, suggestive of an additional appendicolith fragment with surrounding infection. 4. New trace right pleural effusion. 5. Tiny pulmonary nodules at the lung bases, largest 4 mm in the left lower lobe, for which 1 month stability has been demonstrated. If the patient is at high risk for bronchogenic carcinoma, follow-up chest CT at 1 year is recommended. If the patient is at low risk, no follow-up is needed. This recommendation follows the consensus statement: Guidelines for Management of Small Pulmonary Nodules Detected on CT Scans: A Statement from the [place] as published in Radiology 2005; 237:395-400. Electronically Signed   By: Ilona Sorrel M.D.   On: 02/02/2015 12:21    Review of Systems  Constitutional: Negative for fever and chills.  Respiratory: Negative.   Cardiovascular: Negative.   Gastrointestinal: Positive for abdominal  pain.  Skin: Negative.   Neurological: Negative.   Endo/Heme/Allergies: Negative.     Blood pressure 108/65, pulse 105, temperature 98.5 F (36.9 C), temperature source Oral, resp. rate 18, SpO2 97 %. Physical Exam  Constitutional: He appears well-developed and well-nourished. No distress.  HENT:  Head: Normocephalic.  Eyes: Pupils are equal, round, and reactive to light.  Neck: Normal range of motion.  Cardiovascular: Normal rate and regular rhythm.   Respiratory: Effort normal and breath sounds normal.  GI: There is tenderness in the right lower quadrant. There is no rigidity, no guarding and negative Murphy's sign.    Musculoskeletal: Normal range of motion.  Neurological: He is alert.  Skin: Skin is warm and dry.     Assessment/Plan Recurrent intra-abdominal abscess status post percutaneous drainage secondary to ruptured appendix status post appendectomy 11/2014  The patient had a CT scan 4 days ago and had his drain pulled. I reviewed these films and the abscess had  resolved. 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 I'm not sure this will get better without laparotomy. An attempt at drainage percutaneously could be done as well and leave the  drain for longer period of time as an option. He may require a laparotomy to help clear this up as well. He is not acutely ill at this point. I discussed with the help of the hospital translator his options of repeat percutaneous drainage, laparotomy or laparoscopy. There is a potential that he might require a partial colectomy and possible ileostomy or colostomy depending on circumstances. I made him aware of this via the translator. Surgery may be the next best step for him but he has some concerns about the possibility of colon resection and having a possible colostomy even though this would be relatively low. He may be best served by repeat percutaneous drainage, bowel preparation and laparotomy at a later time. Will ask  IR to see to replace percutaneous drain for now since that's the  option the patient's chosen but he is aware that he may require laparotomy to resolve the situation. I discussed the operative risk of surgery of bleeding, infection, bowel resection, colostomy, hernia formation, organ injury, death, DVT, damage to adjacent structures, kidney injury, ureter injury, and the need for other operative procedures. He understands what is involved with surgery and given his previous laparotomy 2 months ago, the presence of a chronic abscess and probably low-grade ongoing infection, surgery does carry significant risk. All this was done with the assistance of the hospital sponsored translator. We'll  admit for IV fluids, pain medicine and IV antibiotics.  Ryken Paschal A. 02/02/2015, 3:04 PM

## 2015-02-03 LAB — COMPREHENSIVE METABOLIC PANEL
ALBUMIN: 3 g/dL — AB (ref 3.5–5.0)
ALT: 23 U/L (ref 17–63)
ANION GAP: 9 (ref 5–15)
AST: 20 U/L (ref 15–41)
Alkaline Phosphatase: 103 U/L (ref 38–126)
BUN: <5 mg/dL — ABNORMAL LOW (ref 6–20)
CO2: 26 mmol/L (ref 22–32)
Calcium: 8.8 mg/dL — ABNORMAL LOW (ref 8.9–10.3)
Chloride: 99 mmol/L — ABNORMAL LOW (ref 101–111)
Creatinine, Ser: 0.69 mg/dL (ref 0.61–1.24)
GFR calc Af Amer: >60 mL/min (ref >60)
GFR calc non Af Amer: >60 mL/min (ref >60)
GLUCOSE: 113 mg/dL — AB (ref 65–99)
POTASSIUM: 4.1 mmol/L (ref 3.5–5.1)
SODIUM: 134 mmol/L — AB (ref 135–145)
TOTAL PROTEIN: 6.8 g/dL (ref 6.5–8.1)
Total Bilirubin: 1 mg/dL (ref 0.3–1.2)

## 2015-02-03 LAB — CBC
HCT: 36 % — ABNORMAL LOW (ref 39.0–52.0)
Hemoglobin: 12.2 g/dL — ABNORMAL LOW (ref 13.0–17.0)
MCH: 26.5 pg (ref 26.0–34.0)
MCHC: 33.9 g/dL (ref 30.0–36.0)
MCV: 78.3 fL (ref 78.0–100.0)
Platelets: 232 10*3/uL (ref 150–400)
RBC: 4.6 MIL/uL (ref 4.22–5.81)
RDW: 12.7 % (ref 11.5–15.5)
WBC: 10 10*3/uL (ref 4.0–10.5)

## 2015-02-03 NOTE — Progress Notes (Signed)
Patient ID: Dale Crane, male   DOB: 04/09/1985, 30 y.o.   MRN: [id number]    Subjective: Feels better today. Denies pain this morning.  Objective: Vital signs in last 24 hours: Temp:  [98.1 F (36.7 C)-100.2 F (37.9 C)] 98.1 F (36.7 C) (01/08 0755) Pulse Rate:  [93-119] 93 (01/08 0755) Resp:  [18-20] 20 (01/08 0755) BP: (100-121)/(54-82) 106/56 mmHg (01/08 0755) SpO2:  [95 %-100 %] 95 % (01/08 0755) Weight:  [87.408 kg (192 lb 11.2 oz)] 87.408 kg (192 lb 11.2 oz) (01/07 1555)    Intake/Output from previous day: 01/07 0701 - 01/08 0700 In: 1290 [P.O.:120; I.V.:1120; IV Piggyback:50] Out: 650 [Urine:650] Intake/Output this shift:    General appearance: alert, cooperative and no distress GI: minimal right upper quadrant tenderness without guarding.  Lab Results:   Recent Labs  02/02/15 1040 02/03/15 0522  WBC 11.1* 10.0  HGB 14.1 12.2*  HCT 40.4 36.0*  PLT 241 232   BMET  Recent Labs  02/02/15 1040 02/03/15 0522  NA 133* 134*  K 4.0 4.1  CL 95* 99*  CO2 26 26  GLUCOSE 118* 113*  BUN 6 <5*  CREATININE 0.71 0.69  CALCIUM 9.2 8.8*     Studies/Results: Ct Abdomen Pelvis W Contrast  02/02/2015  CLINICAL DATA:  Perforated appendicitis in November 2016 complicated by right upper quadrant abscess requiring percutaneous drainage, with removal of drain on 01/29/2015. Recurrent right flank pain. EXAM: CT ABDOMEN AND PELVIS WITH CONTRAST TECHNIQUE: Multidetector CT imaging of the abdomen and pelvis was performed using the standard protocol following bolus administration of intravenous contrast. CONTRAST:  100mL OMNIPAQUE IOHEXOL 300 MG/ML  SOLN COMPARISON:  01/29/2015 CT abdomen/ pelvis. FINDINGS: Lower chest: Anterior subpleural right middle lobe 3 mm pulmonary nodule (series 3/ image 11), stable since 12/13/2014. Subpleural 5 mm plaque associated with the minor fissure (series 3/image 4), stable since 12/13/2014. Left lower lobe 4 mm pulmonary nodule (series  3/image 14), stable since 12/13/2014. New trace right pleural effusion with bilateral lower lobe atelectasis, right greater than left. Hepatobiliary: There is a recurrent thick walled 7.2 x 3.9 x 5.7 cm irregular gas and fluid containing abscess (series 2/ image 41) in the right posterior inferior perihepatic space surrounding the 15 mm appendicolith, which demonstrates mass effect on segment 6 of the right liver lobe. There is new prominent fat stranding surrounding the abscess. Otherwise normal liver. Normal gallbladder with no radiopaque cholelithiasis. No biliary ductal dilatation. Pancreas: Normal, with no mass or duct dilation. Spleen: Normal size. No mass. Adrenals/Urinary Tract: Normal adrenals. Normal kidneys with no hydronephrosis and no renal mass. Normal bladder. Stomach/Bowel: Grossly normal stomach. Normal caliber small bowel with no small bowel wall thickening. The patient is status post appendectomy. There is in increased small amount of free gas in the right lower quadrant adjacent to the appendectomy site (series 2/ image 60). There is an additional tiny 1.6 x 0.9 cm thick-walled collection in the medial right mesentery anterior to the right common iliac artery (series 2/ image 71), which surrounds a tiny 4 mm calcification that could represent an additional appendicolith fragment. Normal large bowel with no diverticulosis, large bowel wall thickening or pericolonic fat stranding. Vascular/Lymphatic: Normal caliber abdominal aorta. Patent portal, splenic, hepatic and renal veins. No pathologically enlarged lymph nodes in the abdomen or pelvis. Reproductive: Normal size prostate and seminal vesicles. Nonspecific faint internal prostatic calcifications, unchanged. Other: No ascites . Musculoskeletal: No aggressive appearing focal osseous lesions. IMPRESSION: 1. Recurrent 7.2 cm abscess in  the right posterior inferior perihepatic space surrounding a 15 mm appendicolith. 2. Small amount of increased  extraluminal gas in the right lower quadrant adjacent to the appendectomy site, cannot exclude a dehiscence at the appendectomy site. 3. Additional tiny 1.6 cm thick-walled collection in the medial right mesentery surrounding a tiny 4 mm calcification, suggestive of an additional appendicolith fragment with surrounding infection. 4. New trace right pleural effusion. 5. Tiny pulmonary nodules at the lung bases, largest 4 mm in the left lower lobe, for which 1 month stability has been demonstrated. If the patient is at high risk for bronchogenic carcinoma, follow-up chest CT at 1 year is recommended. If the patient is at low risk, no follow-up is needed. This recommendation follows the consensus statement: Guidelines for Management of Small Pulmonary Nodules Detected on CT Scans: A Statement from the Fleischner Society as published in Radiology 2005; 237:395-400. Electronically Signed   By: Delbert Phenix M.D.   On: 02/02/2015 12:21    Anti-infectives: Anti-infectives    Start     Dose/Rate Route Frequency Ordered Stop   02/02/15 1400  piperacillin-tazobactam (ZOSYN) IVPB 3.375 g     3.375 g 12.5 mL/hr over 240 Minutes Intravenous 3 times per day 02/02/15 1310     02/02/15 1315  piperacillin-tazobactam (ZOSYN) IVPB 3.375 g     3.375 g 100 mL/hr over 30 Minutes Intravenous  Once 02/02/15 1302 02/02/15 1408      Assessment/Plan: Recurrent right upper quadrant abscess status post laparoscopic appendectomy with fecalith at the center of the abscess. Also smaller abscess with possible small fecalith fragment near the right colon mesentery He will need the fecalith removed in order to prevent recurrent abscesses. Possible laparoscopic or open surgery. Improved on antibiotics for now. Discussed with the patient and questions answered.     LOS: 1 day    Dale Crane 02/03/2015

## 2015-02-03 NOTE — Progress Notes (Signed)
Request seen for IR perc drain.  Pt s/p Appy in November, then perc drain by IR.   Drain removed 01/29/15 by Dr. Fredia SorrowYamagata = resolution of abscess.  Pt has fecolith with recurrent abscess development.  Discussed with Dr. Archer AsaMcCullough and Dr. Donell BeersByerly.    Dr. Archer AsaMcCullough recommends surgery to remove fecolith.  Dr. Donell BeersByerly requested hold off today on procedure and have Dr. Fredia SorrowYamagata (will be IR MD tomorrow 02/04/15) discuss case with Dr. Derrell Lollingamirez.  Tahra Hitzeman S Garreth Burnsworth PA-C 02/03/2015 8:26 AM

## 2015-02-04 ENCOUNTER — Encounter (HOSPITAL_COMMUNITY): Admission: EM | Disposition: A | Discharge status: Home / Self Care | Payer: Self-pay | Source: Home / Self Care

## 2015-02-04 ENCOUNTER — Inpatient Hospital Stay (HOSPITAL_COMMUNITY): Payer: MEDICAID | Admitting: Anesthesiology

## 2015-02-04 ENCOUNTER — Inpatient Hospital Stay (HOSPITAL_COMMUNITY): Payer: Self-pay | Admitting: Anesthesiology

## 2015-02-04 HISTORY — PX: LAPAROSCOPY: SHX197

## 2015-02-04 SURGERY — LAPAROSCOPY, DIAGNOSTIC
Anesthesia: General | Site: Abdomen

## 2015-02-04 MED ORDER — ROCURONIUM BROMIDE 100 MG/10ML IV SOLN
INTRAVENOUS | Status: DC | PRN
  Administered 2015-02-04: 20 mg via INTRAVENOUS
  Administered 2015-02-04: 50 mg via INTRAVENOUS
  Administered 2015-02-04: 20 mg via INTRAVENOUS

## 2015-02-04 MED ORDER — PROMETHAZINE HCL 25 MG/ML IJ SOLN: 6.2500 mg | INTRAMUSCULAR | Status: DC | PRN

## 2015-02-04 MED ORDER — SUGAMMADEX SODIUM 200 MG/2ML IV SOLN
INTRAVENOUS | Status: DC | PRN
  Administered 2015-02-04: 200 mg via INTRAVENOUS

## 2015-02-04 MED ORDER — SUCCINYLCHOLINE CHLORIDE 20 MG/ML IJ SOLN
INTRAMUSCULAR | Status: DC | PRN
  Administered 2015-02-04: 120 mg via INTRAVENOUS

## 2015-02-04 MED ORDER — SODIUM CHLORIDE 0.9 % IR SOLN
Status: DC | PRN
  Administered 2015-02-04: 3000 mL

## 2015-02-04 MED ORDER — BUPIVACAINE HCL 0.25 % IJ SOLN
INTRAMUSCULAR | Status: DC | PRN
Start: 2015-02-04 — End: 2015-02-04
  Administered 2015-02-04: 30 mL

## 2015-02-04 MED ORDER — FENTANYL CITRATE (PF) 100 MCG/2ML IJ SOLN
INTRAMUSCULAR | Status: DC | PRN
  Administered 2015-02-04: 100 ug via INTRAVENOUS
  Administered 2015-02-04: 150 ug via INTRAVENOUS
  Administered 2015-02-04: 100 ug via INTRAVENOUS

## 2015-02-04 MED ORDER — ONDANSETRON HCL 4 MG/2ML IJ SOLN
INTRAMUSCULAR | Status: AC
  Filled 2015-02-04: qty 2

## 2015-02-04 MED ORDER — ONDANSETRON HCL 4 MG/2ML IJ SOLN
INTRAMUSCULAR | Status: DC | PRN
  Administered 2015-02-04: 4 mg via INTRAVENOUS

## 2015-02-04 MED ORDER — FENTANYL CITRATE (PF) 250 MCG/5ML IJ SOLN
INTRAMUSCULAR | Status: AC
  Filled 2015-02-04: qty 5

## 2015-02-04 MED ORDER — HYDROMORPHONE HCL 1 MG/ML IJ SOLN
INTRAMUSCULAR | Status: AC
  Filled 2015-02-04: qty 1

## 2015-02-04 MED ORDER — LACTATED RINGERS IV SOLN
INTRAVENOUS | Status: DC
  Administered 2015-02-04 (×2): via INTRAVENOUS

## 2015-02-04 MED ORDER — HYDROMORPHONE HCL 1 MG/ML IJ SOLN
0.2500 mg | INTRAMUSCULAR | Status: DC | PRN
  Administered 2015-02-04 (×4): 0.5 mg via INTRAVENOUS

## 2015-02-04 MED ORDER — SUGAMMADEX SODIUM 200 MG/2ML IV SOLN
INTRAVENOUS | Status: AC
  Filled 2015-02-04: qty 2

## 2015-02-04 MED ORDER — ENOXAPARIN SODIUM 30 MG/0.3ML ~~LOC~~ SOLN: 30.0000 mg | Freq: Every day | SUBCUTANEOUS | Status: DC

## 2015-02-04 MED ORDER — LIDOCAINE HCL (CARDIAC) 20 MG/ML IV SOLN
INTRAVENOUS | Status: DC | PRN
  Administered 2015-02-04: 60 mg via INTRAVENOUS

## 2015-02-04 MED ORDER — PROPOFOL 10 MG/ML IV BOLUS
INTRAVENOUS | Status: DC | PRN
  Administered 2015-02-04: 150 mg via INTRAVENOUS

## 2015-02-04 MED ORDER — MEPERIDINE HCL 25 MG/ML IJ SOLN: 6.2500 mg | INTRAMUSCULAR | Status: DC | PRN

## 2015-02-04 MED ORDER — 0.9 % SODIUM CHLORIDE (POUR BTL) OPTIME
TOPICAL | Status: DC | PRN
  Administered 2015-02-04: 1000 mL

## 2015-02-04 MED ORDER — MIDAZOLAM HCL 5 MG/5ML IJ SOLN
INTRAMUSCULAR | Status: DC | PRN
  Administered 2015-02-04: 2 mg via INTRAVENOUS

## 2015-02-04 MED ORDER — LACTATED RINGERS IV SOLN: INTRAVENOUS | Status: DC

## 2015-02-04 MED ORDER — BUPIVACAINE HCL (PF) 0.25 % IJ SOLN
INTRAMUSCULAR | Status: AC
  Filled 2015-02-04: qty 30

## 2015-02-04 MED ORDER — MIDAZOLAM HCL 2 MG/2ML IJ SOLN
INTRAMUSCULAR | Status: AC
  Filled 2015-02-04: qty 2

## 2015-02-04 MED ORDER — ENOXAPARIN SODIUM 30 MG/0.3ML ~~LOC~~ SOLN
30.0000 mg | Freq: Every day | SUBCUTANEOUS | Status: DC
  Administered 2015-02-05 – 2015-02-06 (×2): 30 mg via SUBCUTANEOUS
  Filled 2015-02-04 (×2): qty 0.3

## 2015-02-04 SURGICAL SUPPLY — 45 items
BLADE SURG ROTATE 9660 (MISCELLANEOUS) IMPLANT
CANISTER SUCTION 2500CC (MISCELLANEOUS) IMPLANT
CHLORAPREP W/TINT 26ML (MISCELLANEOUS) ×3 IMPLANT
COVER SURGICAL LIGHT HANDLE (MISCELLANEOUS) ×3 IMPLANT
DEVICE TROCAR PUNCTURE CLOSURE (ENDOMECHANICALS) ×3 IMPLANT
DRAIN CHANNEL 19F RND (DRAIN) ×3 IMPLANT
DRAPE LAPAROSCOPIC ABDOMINAL (DRAPES) ×3 IMPLANT
DRAPE WARM FLUID 44X44 (DRAPE) ×3 IMPLANT
ELECT REM PT RETURN 9FT ADLT (ELECTROSURGICAL) ×3
ELECTRODE REM PT RTRN 9FT ADLT (ELECTROSURGICAL) ×1 IMPLANT
EVACUATOR SILICONE 100CC (DRAIN) ×3 IMPLANT
GLOVE BIO SURGEON STRL SZ7.5 (GLOVE) ×6 IMPLANT
GLOVE BIOGEL PI IND STRL 7.0 (GLOVE) ×2 IMPLANT
GLOVE BIOGEL PI IND STRL 7.5 (GLOVE) ×3 IMPLANT
GLOVE BIOGEL PI INDICATOR 7.0 (GLOVE) ×4
GLOVE BIOGEL PI INDICATOR 7.5 (GLOVE) ×6
GLOVE SS N UNI LF 6.5 STRL (GLOVE) ×6 IMPLANT
GOWN STRL REUS W/ TWL LRG LVL3 (GOWN DISPOSABLE) ×2 IMPLANT
GOWN STRL REUS W/ TWL XL LVL3 (GOWN DISPOSABLE) ×1 IMPLANT
GOWN STRL REUS W/TWL LRG LVL3 (GOWN DISPOSABLE) ×4
GOWN STRL REUS W/TWL XL LVL3 (GOWN DISPOSABLE) ×2
KIT BASIN OR (CUSTOM PROCEDURE TRAY) ×3 IMPLANT
KIT ROOM TURNOVER OR (KITS) ×3 IMPLANT
LIQUID BAND (GAUZE/BANDAGES/DRESSINGS) ×3 IMPLANT
NEEDLE INSUFFLATION 14GA 120MM (NEEDLE) ×3 IMPLANT
NS IRRIG 1000ML POUR BTL (IV SOLUTION) ×3 IMPLANT
PAD ARMBOARD 7.5X6 YLW CONV (MISCELLANEOUS) ×6 IMPLANT
POUCH RETRIEVAL ECOSAC 10 (ENDOMECHANICALS) ×1 IMPLANT
POUCH RETRIEVAL ECOSAC 10MM (ENDOMECHANICALS) ×2
SCISSORS LAP 5X35 DISP (ENDOMECHANICALS) IMPLANT
SET IRRIG TUBING LAPAROSCOPIC (IRRIGATION / IRRIGATOR) IMPLANT
SLEEVE ENDOPATH XCEL 5M (ENDOMECHANICALS) ×3 IMPLANT
SUT ETHILON 3 0 FSL (SUTURE) ×3 IMPLANT
SUT MNCRL AB 4-0 PS2 18 (SUTURE) ×3 IMPLANT
TOWEL OR 17X24 6PK STRL BLUE (TOWEL DISPOSABLE) ×3 IMPLANT
TOWEL OR 17X26 10 PK STRL BLUE (TOWEL DISPOSABLE) ×3 IMPLANT
TRAY LAPAROSCOPIC MC (CUSTOM PROCEDURE TRAY) ×3 IMPLANT
TROCAR BLADELESS 11MM (ENDOMECHANICALS) ×3 IMPLANT
TROCAR XCEL 12X100 BLDLESS (ENDOMECHANICALS) IMPLANT
TROCAR XCEL BLUNT TIP 100MML (ENDOMECHANICALS) IMPLANT
TROCAR XCEL NON-BLD 11X100MML (ENDOMECHANICALS) IMPLANT
TROCAR XCEL NON-BLD 5MMX100MML (ENDOMECHANICALS) ×3 IMPLANT
TUBE CONNECTING 12'X1/4 (SUCTIONS) ×1
TUBE CONNECTING 12X1/4 (SUCTIONS) ×2 IMPLANT
TUBING INSUFFLATION (TUBING) ×3 IMPLANT

## 2015-02-04 NOTE — Anesthesia Preprocedure Evaluation (Signed)
Anesthesia Evaluation  Patient identified by MRN, date of birth, ID band Patient awake    Reviewed: Allergy & Precautions, NPO status , Patient's Chart, lab work & pertinent test results  Airway Mallampati: II  TM Distance: >3 FB Neck ROM: Full    Dental  (+) Teeth Intact   Pulmonary neg pulmonary ROS,    breath sounds clear to auscultation       Cardiovascular negative cardio ROS   Rhythm:Regular Rate:Normal     Neuro/Psych negative neurological ROS  negative psych ROS   GI/Hepatic negative GI ROS, Neg liver ROS,   Endo/Other    Renal/GU   negative genitourinary   Musculoskeletal negative musculoskeletal ROS (+)   Abdominal   Peds negative pediatric ROS (+)  Hematology negative hematology ROS (+)   Anesthesia Other Findings   Reproductive/Obstetrics negative OB ROS                             Lab Results  Component Value Date   WBC 10.0 02/03/2015   HGB 12.2* 02/03/2015   HCT 36.0* 02/03/2015   MCV 78.3 02/03/2015   PLT 232 02/03/2015   Lab Results  Component Value Date   INR 1.15 12/20/2014   Lab Results  Component Value Date   CREATININE 0.69 02/03/2015   BUN <5* 02/03/2015   NA 134* 02/03/2015   K 4.1 02/03/2015   CL 99* 02/03/2015   CO2 26 02/03/2015     Anesthesia Physical Anesthesia Plan  ASA: II  Anesthesia Plan: General   Post-op Pain Management:    Induction: Intravenous  Airway Management Planned: Oral ETT  Additional Equipment:   Intra-op Plan:   Post-operative Plan: Extubation in OR  Informed Consent: I have reviewed the patients History and Physical, chart, labs and discussed the procedure including the risks, benefits and alternatives for the proposed anesthesia with the patient or authorized representative who has indicated his/her understanding and acceptance.   Dental advisory given  Plan Discussed with: CRNA  Anesthesia Plan  Comments:         Anesthesia Quick Evaluation

## 2015-02-04 NOTE — Transfer of Care (Signed)
Immediate Anesthesia Transfer of Care Note  Patient: Dale Crane  Procedure(s) Performed: Procedure(s): LAPAROSCOPY DIAGNOSTIC AND WASHOUT (N/A)  Patient Location: PACU  Anesthesia Type:General  Level of Consciousness: awake, alert  and oriented  Airway & Oxygen Therapy: Patient Spontanous Breathing and Patient connected to nasal cannula oxygen  Post-op Assessment: Report given to RN and Post -op Vital signs reviewed and stable  Post vital signs: Reviewed and stable  Last Vitals:  Filed Vitals:   02/03/15 2211 02/04/15 0500  BP: 119/68 107/70  Pulse: 95 93  Temp: 37.2 C 36.8 C  Resp: 17 17    Complications: No apparent anesthesia complications

## 2015-02-04 NOTE — Anesthesia Postprocedure Evaluation (Signed)
Anesthesia Post Note  Patient: Dale Crane  Procedure(s) Performed: Procedure(s) (LRB): LAPAROSCOPY DIAGNOSTIC AND WASHOUT (N/A)  Patient location during evaluation: PACU Anesthesia Type: General Level of consciousness: awake and alert Pain management: pain level controlled Vital Signs Assessment: post-procedure vital signs reviewed and stable Respiratory status: spontaneous breathing, nonlabored ventilation, respiratory function stable and patient connected to nasal cannula oxygen Cardiovascular status: blood pressure returned to baseline and stable Postop Assessment: no signs of nausea or vomiting Anesthetic complications: no    Last Vitals:  Filed Vitals:   02/04/15 1243 02/04/15 1245  BP: 126/62 124/63  Pulse: 115 116  Temp:  37.3 C  Resp: 26 19    Last Pain:  Filed Vitals:   02/04/15 1257  PainSc: 4                  Shelton SilvasKevin D Gwendola Hornaday

## 2015-02-04 NOTE — Anesthesia Procedure Notes (Signed)
Procedure Name: Intubation Date/Time: 02/04/2015 10:01 AM Performed by: Yvonne KendallBECKNER, Deshannon Seide S Patient Re-evaluated:Patient Re-evaluated prior to inductionOxygen Delivery Method: Circle system utilized Preoxygenation: Pre-oxygenation with 100% oxygen Intubation Type: IV induction Laryngoscope Size: Miller and 2 Grade View: Grade II Tube type: Oral Tube size: 7.5 mm Placement Confirmation: ETT inserted through vocal cords under direct vision,  breath sounds checked- equal and bilateral and positive ETCO2 Tube secured with: Tape Dental Injury: Teeth and Oropharynx as per pre-operative assessment

## 2015-02-04 NOTE — Op Note (Signed)
02/04/2015  11:05 AM  PATIENT:  Dale Crane  30 y.o. male  PRE-OPERATIVE DIAGNOSIS:  INTRA ABDOMINAL ABSCESS  POST-OPERATIVE DIAGNOSIS:  Fecalith, intra-abdominal abscess  PROCEDURE:  Procedure(s): LAPAROSCOPY DIAGNOSTIC AND WASHOUT (N/A)  SURGEON:  Surgeon(s) and Role:    * Axel FillerArmando Edith Lord, MD - Primary  ANESTHESIA:   local and general  EBL:  Total I/O In: 176.7 [I.V.:176.7] Out: -   BLOOD ADMINISTERED:none  DRAINS: (19Fr) Jackson-Pratt drain(s) with closed bulb suction in the previous abscess cavity   LOCAL MEDICATIONS USED:  BUPIVICAINE   SPECIMEN:  Source of Specimen:  Fecalith of abdominal abscess  DISPOSITION OF SPECIMEN:  PATHOLOGY  COUNTS:  YES  TOURNIQUET:  * No tourniquets in log *  DICTATION: .Dragon Dictation After the patient was consented he was taken back to the operating room and placed in the supine position with bilateral SCDs in place. Patient underwent general tracheal anesthesia. Patient was placed in the right side up decubitus position. Patient was then prepped and draped in the usual sterile fashion. A timeout was called all facts verified.  Variceal technique was used inspect and 15 mg of mercury in the right subcostal margin. Similar to this, trocar and camera then placed intra-abdominally. There was no injury to any abdominal organs. There is large amount of small bowel and colonic adhesions to the anterior abdominal wall. A second 5 mm trocar was then placed in the right lower quadrant under direct visualization.  A third 11 mm trocar was in place along the subcostal margin mid epigastrium under direct visualization. I then proceeded to bluntly take down the adhesions. I was able to visualize the gallbladder and edge of the liver. The liver was very adherent to the abdominal wall laterally. Upon taking down some of these adhesions a large pocket of purulence was encountered. Suction irrigation was used to evacuate the pocket of pus. I  bluntly broken up the pocket of pus. Upon visualizing the cavity there was a large what appeared to be fecalith. This was removed and placed in an Endo Catch bag. I proceeded to bluntly break up the rest of the abscess cavity. This tracked posterior and inferiorly. This was irrigated out with sterile saline. Secondary to the adhesions in the right lower quadrant and midline I was unable to further suction evacuate any further spillage of the purulence. At this time a [address] [place] drain was then brought into the abdominal cavity placed into the abscess cavity. This was brought out through the 5 mm trocar site at the right subcostal margin. This was secured to the abdominal wall using a 3-0 nylon times one.  The 11 mm trocar site was reapproximated using a 0 Vicryl via an Endo Close device. At this time the insufflation was evacuated all port sites were removed. The skin was dressed with Dermabond. The patient was taken to the recovery room in stable condition. He tolerated the procedure well.  PLAN OF CARE: Admit to inpatient   PATIENT DISPOSITION:  PACU - hemodynamically stable.   Delay start of Pharmacological VTE agent (>24hrs) due to surgical blood loss or risk of bleeding: no

## 2015-02-04 NOTE — Progress Notes (Signed)
Day of Surgery  Subjective: PT with some con't abd/back pain. Abx   Objective: Vital signs in last 24 hours: Temp:  [98.2 F (36.8 C)-99.3 F (37.4 C)] 98.2 F (36.8 C) (01/09 0500) Pulse Rate:  [93-99] 93 (01/09 0500) Resp:  [15-18] 17 (01/09 0500) BP: (107-119)/(64-70) 107/70 mmHg (01/09 0500) SpO2:  [97 %-99 %] 97 % (01/09 0500)    Intake/Output from previous day: 01/08 0701 - 01/09 0700 In: 270 [P.O.:120; IV Piggyback:150] Out: 1000 [Urine:1000] Intake/Output this shift: Total I/O In: 176.7 [I.V.:176.7] Out: -   General appearance: alert and cooperative GI: soft, min ttp, RUQ  Lab Results:   Recent Labs  02/02/15 1040 02/03/15 0522  WBC 11.1* 10.0  HGB 14.1 12.2*  HCT 40.4 36.0*  PLT 241 232   BMET  Recent Labs  02/02/15 1040 02/03/15 0522  NA 133* 134*  K 4.0 4.1  CL 95* 99*  CO2 26 26  GLUCOSE 118* 113*  BUN 6 <5*  CREATININE 0.71 0.69  CALCIUM 9.2 8.8*   PT/INR No results for input(s): LABPROT, INR in the last 72 hours. ABG No results for input(s): PHART, HCO3 in the last 72 hours.  Invalid input(s): PCO2, PO2  Studies/Results: Ct Abdomen Pelvis W Contrast  02/02/2015  CLINICAL DATA:  Perforated appendicitis in November 2016 complicated by right upper quadrant abscess requiring percutaneous drainage, with removal of drain on 01/29/2015. Recurrent right flank pain. EXAM: CT ABDOMEN AND PELVIS WITH CONTRAST TECHNIQUE: Multidetector CT imaging of the abdomen and pelvis was performed using the standard protocol following bolus administration of intravenous contrast. CONTRAST:  100mL OMNIPAQUE IOHEXOL 300 MG/ML  SOLN COMPARISON:  01/29/2015 CT abdomen/ pelvis. FINDINGS: Lower chest: Anterior subpleural right middle lobe 3 mm pulmonary nodule (series 3/ image 11), stable since 12/13/2014. Subpleural 5 mm plaque associated with the minor fissure (series 3/image 4), stable since 12/13/2014. Left lower lobe 4 mm pulmonary nodule (series 3/image 14),  stable since 12/13/2014. New trace right pleural effusion with bilateral lower lobe atelectasis, right greater than left. Hepatobiliary: There is a recurrent thick walled 7.2 x 3.9 x 5.7 cm irregular gas and fluid containing abscess (series 2/ image 41) in the right posterior inferior perihepatic space surrounding the 15 mm appendicolith, which demonstrates mass effect on segment 6 of the right liver lobe. There is new prominent fat stranding surrounding the abscess. Otherwise normal liver. Normal gallbladder with no radiopaque cholelithiasis. No biliary ductal dilatation. Pancreas: Normal, with no mass or duct dilation. Spleen: Normal size. No mass. Adrenals/Urinary Tract: Normal adrenals. Normal kidneys with no hydronephrosis and no renal mass. Normal bladder. Stomach/Bowel: Grossly normal stomach. Normal caliber small bowel with no small bowel wall thickening. The patient is status post appendectomy. There is in increased small amount of free gas in the right lower quadrant adjacent to the appendectomy site (series 2/ image 60). There is an additional tiny 1.6 x 0.9 cm thick-walled collection in the medial right mesentery anterior to the right common iliac artery (series 2/ image 71), which surrounds a tiny 4 mm calcification that could represent an additional appendicolith fragment. Normal large bowel with no diverticulosis, large bowel wall thickening or pericolonic fat stranding. Vascular/Lymphatic: Normal caliber abdominal aorta. Patent portal, splenic, hepatic and renal veins. No pathologically enlarged lymph nodes in the abdomen or pelvis. Reproductive: Normal size prostate and seminal vesicles. Nonspecific faint internal prostatic calcifications, unchanged. Other: No ascites . Musculoskeletal: No aggressive appearing focal osseous lesions. IMPRESSION: 1. Recurrent 7.2 cm abscess in the right  posterior inferior perihepatic space surrounding a 15 mm appendicolith. 2. Small amount of increased extraluminal  gas in the right lower quadrant adjacent to the appendectomy site, cannot exclude a dehiscence at the appendectomy site. 3. Additional tiny 1.6 cm thick-walled collection in the medial right mesentery surrounding a tiny 4 mm calcification, suggestive of an additional appendicolith fragment with surrounding infection. 4. New trace right pleural effusion. 5. Tiny pulmonary nodules at the lung bases, largest 4 mm in the left lower lobe, for which 1 month stability has been demonstrated. If the patient is at high risk for bronchogenic carcinoma, follow-up chest CT at 1 year is recommended. If the patient is at low risk, no follow-up is needed. This recommendation follows the consensus statement: Guidelines for Management of Small Pulmonary Nodules Detected on CT Scans: A Statement from the Fleischner Society as published in Radiology 2005; 237:395-400. Electronically Signed   By: Delbert Phenix M.D.   On: 02/02/2015 12:21    Anti-infectives: Anti-infectives    Start     Dose/Rate Route Frequency Ordered Stop   02/02/15 1400  [MAR Hold]  piperacillin-tazobactam (ZOSYN) IVPB 3.375 g     (MAR Hold since 02/04/15 0847)   3.375 g 12.5 mL/hr over 240 Minutes Intravenous 3 times per day 02/02/15 1310     02/02/15 1315  piperacillin-tazobactam (ZOSYN) IVPB 3.375 g     3.375 g 100 mL/hr over 30 Minutes Intravenous  Once 02/02/15 1302 02/02/15 1408      Assessment/Plan: s/p Procedure(s): LAPAROSCOPY DIAGNOSTIC AND WASHOUT (N/A) To OR today for Dx lap and washout and drain placement I d/w pt the risks and benefits of procedure to include, bleeding, damage to surrounding structure, possible need for further surgery, possible open surgery.  The patient voiced understanding and wishes to proceed.   LOS: 2 days    Marigene Ehlers., [facility] 02/04/2015

## 2015-02-04 NOTE — Progress Notes (Signed)
Spoke through a spanish interpreter via the language line.  Dr. Hart RochesterHollis present and info exchanged.  DA

## 2015-02-05 ENCOUNTER — Encounter (HOSPITAL_COMMUNITY): Payer: Self-pay | Admitting: General Surgery

## 2015-02-05 NOTE — Progress Notes (Signed)
Patient ID: Dale Crane, male   DOB: [birth year], 30 y.o.   MRN: [id number] 1 Day Post-Op  Subjective: Translator used.  Patient feels ok, but tired and having some pain.  No nausea.  Tolerating clear liquids.  Some flatus, but no BM yet.  Objective: Vital signs in last 24 hours: Temp:  [98.1 F (36.7 C)-99.2 F (37.3 C)] 98.7 F (37.1 C) (01/10 0629) Pulse Rate:  [98-118] 98 (01/10 0629) Resp:  [16-30] 18 (01/10 0629) BP: (107-136)/(53-69) 107/61 mmHg (01/10 0629) SpO2:  [93 %-98 %] 98 % (01/10 0629)    Intake/Output from previous day: 01/09 0701 - 01/10 0700 In: 2034.3 [P.O.:480; I.V.:1369.3; IV Piggyback:150] Out: 1940 [Urine:1925; Drains:15] Intake/Output this shift:    PE: Abd: soft, appropriately tender, hypoactive BS, JP drain with serosang output, ND Heart: regular Lungs: CTAB  Lab Results:   Recent Labs  02/02/15 1040 02/03/15 0522  WBC 11.1* 10.0  HGB 14.1 12.2*  HCT 40.4 36.0*  PLT 241 232   BMET  Recent Labs  02/02/15 1040 02/03/15 0522  NA 133* 134*  K 4.0 4.1  CL 95* 99*  CO2 26 26  GLUCOSE 118* 113*  BUN 6 <5*  CREATININE 0.71 0.69  CALCIUM 9.2 8.8*   PT/INR No results for input(s): LABPROT, INR in the last 72 hours. CMP     Component Value Date/Time   NA 134* 02/03/2015 0522   K 4.1 02/03/2015 0522   CL 99* 02/03/2015 0522   CO2 26 02/03/2015 0522   GLUCOSE 113* 02/03/2015 0522   BUN <5* 02/03/2015 0522   CREATININE 0.69 02/03/2015 0522   CALCIUM 8.8* 02/03/2015 0522   PROT 6.8 02/03/2015 0522   ALBUMIN 3.0* 02/03/2015 0522   AST 20 02/03/2015 0522   ALT 23 02/03/2015 0522   ALKPHOS 103 02/03/2015 0522   BILITOT 1.0 02/03/2015 0522   GFRNONAA >60 02/03/2015 0522   GFRAA >60 02/03/2015 0522   Lipase     Component Value Date/Time   LIPASE 25 12/13/2014 0024       Studies/Results: No results found.  Anti-infectives: Anti-infectives    Start     Dose/Rate Route Frequency Ordered Stop   02/02/15 1400   piperacillin-tazobactam (ZOSYN) IVPB 3.375 g     3.375 g 12.5 mL/hr over 240 Minutes Intravenous 3 times per day 02/02/15 1310     02/02/15 1315  piperacillin-tazobactam (ZOSYN) IVPB 3.375 g     3.375 g 100 mL/hr over 30 Minutes Intravenous  Once 02/02/15 1302 02/02/15 1408       Assessment/Plan  1. POD 1, s/p dx lap with drainage of intra-abdominal abscess and removal of fecolith, Dr. Derrell Lollingamirez -on full liquids today, diet advancement as tolerates -oral pain meds -mobilize and pulm toilet -Zosyn D2, cont for at least 7 days -cont drain for now 2. DVT proph scds/lovenox   LOS: 3 days    Terry Abila E 02/05/2015, 8:34 AM Pager: [phone]

## 2015-02-06 LAB — BASIC METABOLIC PANEL
Anion gap: 8 (ref 5–15)
BUN: <5 mg/dL — ABNORMAL LOW (ref 6–20)
CHLORIDE: 97 mmol/L — AB (ref 101–111)
CO2: 25 mmol/L (ref 22–32)
CREATININE: 0.51 mg/dL — AB (ref 0.61–1.24)
Calcium: 8.2 mg/dL — ABNORMAL LOW (ref 8.9–10.3)
GFR calc non Af Amer: >60 mL/min (ref >60)
Glucose, Bld: 125 mg/dL — ABNORMAL HIGH (ref 65–99)
POTASSIUM: 4 mmol/L (ref 3.5–5.1)
Sodium: 130 mmol/L — ABNORMAL LOW (ref 135–145)

## 2015-02-06 LAB — CBC
HEMATOCRIT: 32.8 % — AB (ref 39.0–52.0)
HEMOGLOBIN: 11.2 g/dL — AB (ref 13.0–17.0)
MCH: 26.6 pg (ref 26.0–34.0)
MCHC: 34.1 g/dL (ref 30.0–36.0)
MCV: 77.9 fL — ABNORMAL LOW (ref 78.0–100.0)
Platelets: 231 10*3/uL (ref 150–400)
RBC: 4.21 MIL/uL — AB (ref 4.22–5.81)
RDW: 12.6 % (ref 11.5–15.5)
WBC: 10.8 10*3/uL — ABNORMAL HIGH (ref 4.0–10.5)

## 2015-02-06 MED ORDER — ENOXAPARIN SODIUM 40 MG/0.4ML ~~LOC~~ SOLN
40.0000 mg | SUBCUTANEOUS | Status: DC
  Administered 2015-02-07 – 2015-02-08 (×2): 40 mg via SUBCUTANEOUS
  Filled 2015-02-06 (×2): qty 0.4

## 2015-02-06 NOTE — Progress Notes (Signed)
Interpreter Ashby DawesGraciela for Jabil CircuitJoan RN

## 2015-02-06 NOTE — Progress Notes (Signed)
Patient refused the scheduled ambulation in hall and states, "No walk tonight, tired, tomorrow in day walk".

## 2015-02-06 NOTE — Progress Notes (Signed)
Patient ID: Dale Crane, male   DOB: 6Abelardo Diesel/27/1987, 30 y.o.   MRN: [id number] 2 Days Post-Op  Subjective: Pt feels ok today.  Still having some pain.  On a regular diet, but hasn't eaten it yet this morning.  +flatus, no BM yet.  No nausea  Objective: Vital signs in last 24 hours: Temp:  [97.9 F (36.6 C)-98.1 F (36.7 C)] 98.1 F (36.7 C) (01/10 2134) Pulse Rate:  [94-109] 94 (01/10 2134) Resp:  [18-20] 18 (01/10 2134) BP: (102-104)/(59-66) 104/66 mmHg (01/10 2134) SpO2:  [97 %-98 %] 98 % (01/10 2134)    Intake/Output from previous day: 01/10 0701 - 01/11 0700 In: 3140 [P.O.:840; I.V.:2200; IV Piggyback:100] Out: 2070 [Urine:2050; Drains:20] Intake/Output this shift:    PE: Abd: soft, +Bs, minimal bloating, tender appropriately, JP drian with serosang output Heart: regular Lungs: CTAB  Lab Results:   Recent Labs  02/06/15 0418  WBC 10.8*  HGB 11.2*  HCT 32.8*  PLT 231   BMET  Recent Labs  02/06/15 0418  NA 130*  K 4.0  CL 97*  CO2 25  GLUCOSE 125*  BUN <5*  CREATININE 0.51*  CALCIUM 8.2*   PT/INR No results for input(s): LABPROT, INR in the last 72 hours. CMP     Component Value Date/Time   NA 130* 02/06/2015 0418   K 4.0 02/06/2015 0418   CL 97* 02/06/2015 0418   CO2 25 02/06/2015 0418   GLUCOSE 125* 02/06/2015 0418   BUN <5* 02/06/2015 0418   CREATININE 0.51* 02/06/2015 0418   CALCIUM 8.2* 02/06/2015 0418   PROT 6.8 02/03/2015 0522   ALBUMIN 3.0* 02/03/2015 0522   AST 20 02/03/2015 0522   ALT 23 02/03/2015 0522   ALKPHOS 103 02/03/2015 0522   BILITOT 1.0 02/03/2015 0522   GFRNONAA >60 02/06/2015 0418   GFRAA >60 02/06/2015 0418   Lipase     Component Value Date/Time   LIPASE 25 12/13/2014 0024       Studies/Results: No results found.  Anti-infectives: Anti-infectives    Start     Dose/Rate Route Frequency Ordered Stop   02/02/15 1400  piperacillin-tazobactam (ZOSYN) IVPB 3.375 g     3.375 g 12.5 mL/hr over 240  Minutes Intravenous 3 times per day 02/02/15 1310     02/02/15 1315  piperacillin-tazobactam (ZOSYN) IVPB 3.375 g     3.375 g 100 mL/hr over 30 Minutes Intravenous  Once 02/02/15 1302 02/02/15 1408       Assessment/Plan  1. POD 2, s/p dx lap with drainage of intra-abdominal abscess and removal of fecolith, Dr. Derrell Lollingamirez -on solid diet -stool softener -oral pain meds -mobilize and pulm toilet -Zosyn D3, will need at least 14 days of oral augmentin at discharge -recheck labs in am -cont drain for now 2. DVT proph scds/lovenox    LOS: 4 days    Helayne Metsker E 02/06/2015, 9:22 AM Pager: [phone]

## 2015-02-07 LAB — BASIC METABOLIC PANEL
ANION GAP: 6 (ref 5–15)
CALCIUM: 8.2 mg/dL — AB (ref 8.9–10.3)
CO2: 28 mmol/L (ref 22–32)
CREATININE: 0.73 mg/dL (ref 0.61–1.24)
Chloride: 98 mmol/L — ABNORMAL LOW (ref 101–111)
GFR calc Af Amer: >60 mL/min (ref >60)
GLUCOSE: 125 mg/dL — AB (ref 65–99)
Potassium: 3.6 mmol/L (ref 3.5–5.1)
Sodium: 132 mmol/L — ABNORMAL LOW (ref 135–145)

## 2015-02-07 LAB — CBC
HCT: 34.1 % — ABNORMAL LOW (ref 39.0–52.0)
Hemoglobin: 11.6 g/dL — ABNORMAL LOW (ref 13.0–17.0)
MCH: 26.3 pg (ref 26.0–34.0)
MCHC: 34 g/dL (ref 30.0–36.0)
MCV: 77.3 fL — AB (ref 78.0–100.0)
PLATELETS: 254 10*3/uL (ref 150–400)
RBC: 4.41 MIL/uL (ref 4.22–5.81)
RDW: 12.5 % (ref 11.5–15.5)
WBC: 10.4 10*3/uL (ref 4.0–10.5)

## 2015-02-07 LAB — CULTURE, BLOOD (ROUTINE X 2)
CULTURE: NO GROWTH
CULTURE: NO GROWTH

## 2015-02-07 NOTE — Care Management Note (Signed)
Case Management Note  Patient Details  Name: Dale Crane MRN: [id number] Date of Birth: 1985/06/16  Subjective/Objective:                    Action/Plan:  UR updated . Will provide MATCH letter on day of discharge . Expected Discharge Date:                  Expected Discharge Plan:  Home/Self Care  In-House Referral:     Discharge planning Services  CM Consult, MATCH Program, Medication Assistance, Indigent Health Clinic  Post Acute Care Choice:    Choice offered to:     DME Arranged:    DME Agency:     HH Arranged:    HH Agency:     Status of Service:  In process, will continue to follow  Medicare Important Message Given:    Date Medicare IM Given:    Medicare IM give by:    Date Additional Medicare IM Given:    Additional Medicare Important Message give by:     If discussed at Long Length of Stay Meetings, dates discussed:  02-07-15  Additional Comments:  Dale Crane, Dale Taras Marie, RN 02/07/2015, 11:22 AM

## 2015-02-07 NOTE — Discharge Instructions (Signed)
CIRUGIA LAPAROSCOPICA: INSTRUCCIONES DE POST OPERATORIO. ° °Revise siempre los documentos que le entreguen en el lugar donde se ha hecho la cirugia. ° °SI USTED NECESITA DOCUMENTOS DE INCAPACIDAD (DISABLE) O DE PERMISO FAMILAR (FAMILY LEAVE) NECESITA TRAERLOS A LA OFICINA PARA QUE SEAN PROCESADOS. °NO  SE LOS DE A SU DOCTOR. °1. A su alta del hospital se le dara una receta para controlar el dolor. Tomela como ha sido recetada, si la necesita. Si no la necesita puede tomar, Acetaminofen (Tylenol) o Ibuprofen (Advil) para aliviar dolor moderado. °2. Continue tomando el resto de sus medicinas. °3. Si necesita rellenar la receta, llame a la farmacia. ellos contactan a nuestra oficina pidiendo autorizacion. Este tipo de receta no pueden ser rellenadas despues de las  5pm o durante los fines de semana. °4. Con relacion a la dieta: debe ser ligera los primeros dias despues que llege a la casa. Ejemplo: sopas y galleticas. Tome bastante liquido esos dias. °5. La mayoria de los pacientes padecen de inflamacion y cambio de coloracion de la piel alrededor de las incisiones. esto toma dias en resolver.  pnerse una bolsa de hielo en el area affectada ayuda..  °6. Es comun tambien tener un poco de estrenimiento si esta tomado medicinas para el dolor. incremente la cantidad de liquidos a tomar y puede tomar (Colace) esto previene el problema. Si ya tiene estrenimiento, es decir no ha defecado en 48 horas, puede tomar un laxativo (Milk of Magnesia or Miralax) uselo como el paquete le explica. °7.  A menos que se le diga algo diferente. Remueva el bendaje a las 24-48 horas despues dela cirugia. y puede banarse en la ducha sin ningun problema. usted puede tener steri-strips (pequenas curitas transparentes en la piel puesta encima de la incision)  Estas banditas strips should be left on the skin for 7-10 days.   Si su cirujano puso pegamento encima de la incision usted puede banarse bajo la ducha en 24 horas. Este pegamento empezara a  caerse en las proximas 2-3 semanas. Si le pusieron suturas o presillas (grapos) estos seran quitados en su proxima cita en la oficina. . °a. ACTIVIDADES:  Puede hacer actividad ligera.  Como caminar , subir escaleras y poco a poco irlas incrementando tanto como las tolere. Puede tener relaciones sexuales cuando sea comfortable. No carge objetos pesados o haga esfuerzos que no sean aprovados por su doctor. °b. Puede manejar en cuanto no esta tomando medicamentos fuertes (narcoticos) para el dolor, pueda abrochar confortablemente el cinturon de seguridad, y pueda maniobrar y usar los pedales de su vehiculo con seguridad. °c. PUEDE REGRESAR A TRABAJAR  °8. Debe ver a su doctor para una cita de seguimiento en 2-3 semanas despues de la cirugia.  °9. OTRAS ISNSTRUCCIONES:___________________________________________________________________________________ °CUANDO LLAMAR A SU MEDICO: °1. FIEBRE mayor de  101.0 °2. No produccion de orina. °3. Sangramiento continue de la herida °4. Incremento de dolor, enrojecimientio o drenaje de la herida (incision) °5. Incremento de dolor abdominal. ° °The clinic staff is available to answer your questions during regular business hours.  Please don’t hesitate to call and ask to speak to one of the nurses for clinical concerns.  If you have a medical emergency, go to the nearest emergency room or call 911.  A surgeon from [facility] is always on call at the hospital. °[address], [address], [place], [place]  [address] ? [address] [address], [place], [place]   [address] °[phone] ? [phone] ? FAX [phone] °Web site: [email or web address] ° ° °  Cuidados en el hogar del drenaje con bulbo (Bulb [facility]) Un drenaje con bulbo consiste en un tubo delgado de goma y un bulbo blando, redondo que crea una succin Nescopecksuave. El tubo de goma se coloca en la zona donde le practicaron la Azerbaijanciruga. El bulbo est unido al extremo del tubo y est fuera del cuerpo. El  drenaje con bulbo elimina el exceso de lquido que normalmente se acumula en una herida quirrgica despus de la Azerbaijanciruga. El color y la cantidad de lquido pueden variar. Inmediatamente despus de la Azerbaijanciruga, el lquido es de color rojo brillante y es un poco ms espeso que el agua. Puede cambiar gradualmente a un color amarillo o rosa y volverse ms lquido, similar al agua. En general, si la cantidad disminuye a aproximadamente 1o 2cucharadas en 24horas, el mdico le retirar el drenaje. CUIDADOS DIARIOS  Mantenga el bulbo aplanado (comprimido) en todo momento, excepto cuando deba vaciarlo. Al estar plano se crea la succin. Puede aplanar el bulbo apretndolo firmemente en el medio, cerrando luego el tapn.  Mantenga la piel del lugar en el que el tubo entra en la piel seca y Afghanistancubierta con un vendaje (apsito).  Sostenga el tubo con Qatarcinta adhesiva, a 1o 2pulgadas (2,5 a 5,1cm) por debajo del lugar de la insercin para que no tire de los puntos. El tubo est suturado en su lugar y no se saldr.  Ajuste el bulbo como le indic el mdico.  Los primeros 3 das despus de la ciruga, habr ms lquido en el bulbo. Vace el bulbo cada vez que se haya llenado hasta la mitad, debido a que el bulbo no crea la suficiente succin si est demasiado lleno. El bulbo tambin podra desbordarse. Anote la cantidad de lquido que retira cada vez que vace el drenaje. Sume la cantidad que retira en 24 horas.  Vace el bulbo a la misma hora todos los das una vez que disminuya la cantidad de lquido y solo tenga que vaciarlo una vez al da. Anote las cantidades y los totales de 24horas para informar a su mdico. Esto ayuda a su mdico a saber cundo se pueden Personal assistantretirar los tubos. VACIADO DEL DRENAJE CON BULBO Antes de vaciar el bulbo, consiga una taza Malinmedidora, un trozo de papel, Neomia Dearuna lapicera y Verizonlvese las manos.  Pase suavemente los dedos por el tubo (barrido) para Engineering geologistvaciar el drenaje que se encuentra en el tubo  hacia el bulbo. Tal vez necesite repetir esta operacin varias veces por da para eliminar los cogulos y tejidos del tubo.  Abra la tapa del bulbo para liberar la succin, con lo cual se inflar. No toque el interior de la tapa.  Pase suavemente los dedos por el tubo (barrido) para Engineering geologistvaciar el drenaje que se encuentra en el tubo hacia el bulbo.  Mantenga la tapa fuera y vierta el lquido en un recipiente medidor.  Apriete el bulbo para hacer succin.  Vuelva a colocar la tapa.  Controle la cinta adhesiva que sujeta el tubo a la piel. Cuando se afloja, quite el trozo suelto de cinta y The Sherwin-Williamsaplique uno nuevo. Luego fije el bulbo a su camisa.  Anote la cantidad de lquido que elimin. Anote la fecha cada vez que vace el drenaje del bulbo. (Si hay 2 bulbos, tenga en cuenta la cantidad de drenaje de cada uno y Deerfieldmantenga los totales separados. El mdico querr saber las cantidades totales de cada tubo de drenaje y cul es el que drena ms).  Tire el lquido por  el inodoro y [employer].  Llame a su mdico cuando tenga menos de 2cucharadas de lquido acumulado en el bulbo de drenaje durante 24horas. Si hay drenaje alrededor del sitio del tubo, cambie los vendajes y [place] el rea seca. Limpie alrededor del tubo con solucin salina estril y coloque una gasa seca alrededor del sitio. Esta gasa debe cambiarse cuando est sucia. Si se mantiene limpio y sin suciedad, igualmente debe cambiarlo a diario.  SOLICITE ATENCIN MDICA SI:  El drenaje tiene mal olor o est turbio.  Tiene fiebre.  El drenaje aumenta en lugar de disminuir.  El tubo se cae.  Tiene hinchazn o enrojecimiento alrededor del tubo.  Tiene un drenaje en Reynaldo Minium.  El bulbo no se queda plano despus de vaciarlo. ASEGRESE DE QUE:   Comprende estas instrucciones.  Controlar su afeccin.  Recibir ayuda de inmediato si no mejora o si empeora.   Esta informacin no tiene Theme park manager el consejo del  mdico. Asegrese de hacerle al mdico cualquier pregunta que tenga.   Document Released: 01/12/2005 Document Revised: 11/02/2012 Elsevier Interactive Patient Education [employer]

## 2015-02-07 NOTE — Progress Notes (Signed)
3 Days Post-Op  Subjective: Pt with no complaints overnight.  Some nausea with meals.  Ambulating in hall  Objective: Vital signs in last 24 hours: Temp:  [98.1 F (36.7 C)-99 F (37.2 C)] 99 F (37.2 C) (01/12 0612) Pulse Rate:  [96-110] 106 (01/12 0612) Resp:  [17-19] 19 (01/12 0612) BP: (109-116)/(65-71) 109/68 mmHg (01/12 0612) SpO2:  [95 %-97 %] 96 % (01/12 0612) Last BM Date: 02/06/15 (per pt)  Intake/Output from previous day: 01/11 0701 - 01/12 0700 In: 3065 [P.O.:480; I.V.:2385; IV Piggyback:200] Out: 1005 [Urine:1000; Drains:5] Intake/Output this shift: Total I/O In: -  Out: 275 [Urine:275]  General appearance: alert and cooperative GI: soft, non-tender; bowel sounds normal; no masses,  no organomegaly and JP SS  Lab Results:   Recent Labs  02/06/15 0418 02/07/15 0501  WBC 10.8* 10.4  HGB 11.2* 11.6*  HCT 32.8* 34.1*  PLT 231 254   BMET  Recent Labs  02/06/15 0418 02/07/15 0501  NA 130* 132*  K 4.0 3.6  CL 97* 98*  CO2 25 28  GLUCOSE 125* 125*  BUN <5* <5*  CREATININE 0.51* 0.73  CALCIUM 8.2* 8.2*    Anti-infectives: Anti-infectives    Start     Dose/Rate Route Frequency Ordered Stop   02/02/15 1400  piperacillin-tazobactam (ZOSYN) IVPB 3.375 g     3.375 g 12.5 mL/hr over 240 Minutes Intravenous 3 times per day 02/02/15 1310     02/02/15 1315  piperacillin-tazobactam (ZOSYN) IVPB 3.375 g     3.375 g 100 mL/hr over 30 Minutes Intravenous  Once 02/02/15 1302 02/02/15 1408      Assessment/Plan: s/p Procedure(s): LAPAROSCOPY DIAGNOSTIC AND WASHOUT (N/A) Plan for discharge tomorrow  Con't abx Will need PO abx at DC Mobilize  LOS: 5 days    Marigene Ehlersamirez Jr., [facility] 02/07/2015

## 2015-02-08 MED ORDER — DOCUSATE SODIUM 100 MG PO CAPS: 100.0000 mg | ORAL_CAPSULE | Freq: Two times a day (BID) | ORAL | Status: AC

## 2015-02-08 MED ORDER — OXYCODONE HCL 5 MG PO TABS: 5.0000 mg | ORAL_TABLET | Freq: Four times a day (QID) | ORAL | Status: DC | PRN

## 2015-02-08 MED ORDER — AMOXICILLIN-POT CLAVULANATE 875-125 MG PO TABS: 1.0000 | ORAL_TABLET | Freq: Two times a day (BID) | ORAL | Status: DC

## 2015-02-08 MED ORDER — SACCHAROMYCES BOULARDII 250 MG PO CAPS: 250.0000 mg | ORAL_CAPSULE | Freq: Two times a day (BID) | ORAL | Status: AC

## 2015-02-08 NOTE — Progress Notes (Signed)
Pt DC at this time. Supplies given for drain care. Rx and match letter given to pt.. AVS printed in Spanish and instructed in Spanish when to return to office for follow up.

## 2015-02-08 NOTE — Discharge Summary (Signed)
[facility] Discharge Summary   Patient ID: Dale Crane MRN: [id number] DOB/AGE: July 27, 1985 29 y.o.  Admit date: 02/02/2015 Discharge date: 02/08/2015  Admitting Diagnosis: Intra-abdominal abscess post-procedure Postoperative intra-abdominal abscess   Discharge Diagnosis Patient Active Problem List   Diagnosis Date Noted  . Intra-abdominal abscess post-procedure (HCC) 02/02/2015  . Postoperative intra-abdominal abscess (HCC)   . Intraabdominal fluid collection   . Ileus, postoperative 12/18/2014  . Perforated appendicitis s/p lap appendectomy 12/13/2014 12/13/2014    Consultants None  Imaging: No results found.  Procedures Dr. Derrell Crane (02/03/14) - Laparoscopic diagnostic with washout and removal of fecalith  Hospital Course:  30 y/o Hispanic male presents the emergency room with a 2 day history of abdominal pain. His history is significant for appendectomy and November 2016 for perforated appendix. His course complicated by postoperative abscess along the right colon and right infrahepatic space treated with a percutaneous drain the last 7 weeks. He had his drain removed 4 days ago. He been developed more pain along his right abdomen without fever or chills. CT scan was obtained which showed recurrence of the abscess with a fecalith it's been present since November 2016. He has no fever or chills. I'm able to communicate with him via the help of the hospital translator. No nausea or vomiting.  Patient was admitted and underwent procedure listed above.  Tolerated procedure well and was transferred to the floor.  Diet was slowly advanced as tolerated.  His JP drain will be left in place at discharge until follow up appointment.  He knows how to take care of it because he had them at discharge last time.  On POD #4, the patient was voiding well, tolerating diet, ambulating well, pain well controlled, vital signs stable, incisions c/d/i and felt stable for  discharge home.  Patient will follow up in our office in 2 weeks with Dr. Janee Crane and knows to call with questions or concerns.  He will continue Augmentin at discharge for 2 additional weeks.  We encouraged a bowel regimen and florastor at discharge.  All the post-op directions were reviewed using the interpretor line.        Medication List    STOP taking these medications        cefpodoxime 200 MG tablet  Commonly known as:  VANTIN     ciprofloxacin 500 MG tablet  Commonly known as:  CIPRO     oxyCODONE-acetaminophen 5-325 MG tablet  Commonly known as:  PERCOCET/ROXICET      TAKE these medications        amoxicillin-clavulanate 875-125 MG tablet  Commonly known as:  AUGMENTIN  Take 1 tablet by mouth 2 (two) times daily.     docusate sodium 100 MG capsule  Commonly known as:  COLACE  Take 1 capsule (100 mg total) by mouth 2 (two) times daily.     oxyCODONE 5 MG immediate release tablet  Commonly known as:  Oxy IR/ROXICODONE  Take 1-2 tablets (5-10 mg total) by mouth every 6 (six) hours as needed for moderate pain or severe pain.     saccharomyces boulardii 250 MG capsule  Commonly known as:  FLORASTOR  Take 1 capsule (250 mg total) by mouth 2 (two) times daily.         Follow-up Information    Follow up with [facility] On 02/13/2015.   Specialty:  General Surgery   Why:  10:30am for JP drain removal, arrive by 10:15am for check in   Contact information:  [address] [place] [address] [phone]       Follow up with Dale Crane,Dale Crane, Dale Crane On 02/27/2015.   Specialty:  General Surgery   Why:  9:10am, arrive by 8:55am for check in   Contact information:   [address] [place] [address] [phone]       Signed: Nonie HoyerMegan N. Hani Crane, [facility] [phone]  02/08/2015, 2:58 PM

## 2015-02-08 NOTE — Care Management (Signed)
MATCH letter given .

## 2015-02-08 NOTE — Progress Notes (Signed)
Interpreter Wyvonnia DuskyGraciela Namihira for DIRECTVonia Nurse tech

## 2015-02-08 NOTE — Progress Notes (Signed)
[facility] Progress Note  4 Days Post-Op  Subjective: Pt doing well, tolerating solid diet.  Ambulating well.  No N/V.  Drain with serosanguinous drainage.  Having BM's and flatus.    Objective: Vital signs in last 24 hours: Temp:  [98.3 F (36.8 C)-100 F (37.8 C)] 98.9 F (37.2 C) (01/13 0611) Pulse Rate:  [93-99] 93 (01/13 0611) Resp:  [18] 18 (01/13 0611) BP: (99-117)/(60-75) 117/75 mmHg (01/13 0611) SpO2:  [95 %-99 %] 95 % (01/13 0611) Last BM Date: 02/06/15  Intake/Output from previous day: 01/12 0701 - 01/13 0700 In: 2355 [P.O.:1080; I.V.:1225; IV Piggyback:50] Out: 2017.5 [Urine:2015; Drains:2.5] Intake/Output this shift:    PE: Gen:  Alert, NAD, pleasant Abd: Soft, mild tenderness, ND, +BS, no HSM, incisions C/D/I, drain with serosanguinous drainage   Lab Results:   Recent Labs  02/06/15 0418 02/07/15 0501  WBC 10.8* 10.4  HGB 11.2* 11.6*  HCT 32.8* 34.1*  PLT 231 254   BMET  Recent Labs  02/06/15 0418 02/07/15 0501  NA 130* 132*  K 4.0 3.6  CL 97* 98*  CO2 25 28  GLUCOSE 125* 125*  BUN <5* <5*  CREATININE 0.51* 0.73  CALCIUM 8.2* 8.2*   PT/INR No results for input(s): LABPROT, INR in the last 72 hours. CMP     Component Value Date/Time   NA 132* 02/07/2015 0501   K 3.6 02/07/2015 0501   CL 98* 02/07/2015 0501   CO2 28 02/07/2015 0501   GLUCOSE 125* 02/07/2015 0501   BUN <5* 02/07/2015 0501   CREATININE 0.73 02/07/2015 0501   CALCIUM 8.2* 02/07/2015 0501   PROT 6.8 02/03/2015 0522   ALBUMIN 3.0* 02/03/2015 0522   AST 20 02/03/2015 0522   ALT 23 02/03/2015 0522   ALKPHOS 103 02/03/2015 0522   BILITOT 1.0 02/03/2015 0522   GFRNONAA >60 02/07/2015 0501   GFRAA >60 02/07/2015 0501   Lipase     Component Value Date/Time   LIPASE 25 12/13/2014 0024       Studies/Results: No results found.  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  POD #4 s/p Laparoscopic diagnostic and washout -on solid diet -stool softener -oral pain meds -mobilize and pulm toilet -Zosyn Day #5, will need at least 14 days of oral augmentin at discharge -WBC normalized yesterday, afebrile -cont drain for now, reportedly only put out 2.575mL in the last 24 hours DVT proph -SCD's and lovenox Disp -Plan to d/c today    LOS: 6 days    Nonie HoyerMegan N Noely Kuhnle 02/08/2015, 10:03 AM Pager: [phone]

## 2015-02-13 ENCOUNTER — Other Ambulatory Visit (HOSPITAL_COMMUNITY): Payer: Self-pay | Admitting: Interventional Radiology

## 2015-02-13 ENCOUNTER — Other Ambulatory Visit: Payer: Self-pay | Admitting: General Surgery

## 2015-02-13 DIAGNOSIS — K651 Peritoneal abscess: Principal | ICD-10-CM

## 2015-02-13 DIAGNOSIS — T814XXD Infection following a procedure, subsequent encounter: Principal | ICD-10-CM

## 2015-02-13 DIAGNOSIS — IMO0001 Reserved for inherently not codable concepts without codable children: Secondary | ICD-10-CM

## 2015-02-14 ENCOUNTER — Encounter (HOSPITAL_COMMUNITY): Payer: Self-pay | Admitting: *Deleted

## 2015-02-14 DIAGNOSIS — K658 Other peritonitis: Principal | ICD-10-CM | POA: Diagnosis present

## 2015-02-14 DIAGNOSIS — K5900 Constipation, unspecified: Secondary | ICD-10-CM | POA: Diagnosis present

## 2015-02-14 LAB — COMPREHENSIVE METABOLIC PANEL
ALBUMIN: 3.4 g/dL — AB (ref 3.5–5.0)
ALK PHOS: 91 U/L (ref 38–126)
ALT: 67 U/L — AB (ref 17–63)
ANION GAP: 10 (ref 5–15)
AST: 45 U/L — ABNORMAL HIGH (ref 15–41)
BUN: <5 mg/dL — ABNORMAL LOW (ref 6–20)
CALCIUM: 9.3 mg/dL (ref 8.9–10.3)
CO2: 27 mmol/L (ref 22–32)
CREATININE: 0.76 mg/dL (ref 0.61–1.24)
Chloride: 99 mmol/L — ABNORMAL LOW (ref 101–111)
GFR calc Af Amer: >60 mL/min (ref >60)
GFR calc non Af Amer: >60 mL/min (ref >60)
GLUCOSE: 104 mg/dL — AB (ref 65–99)
Potassium: 3.9 mmol/L (ref 3.5–5.1)
SODIUM: 136 mmol/L (ref 135–145)
Total Bilirubin: 0.5 mg/dL (ref 0.3–1.2)
Total Protein: 8.1 g/dL (ref 6.5–8.1)

## 2015-02-14 LAB — URINALYSIS, ROUTINE W REFLEX MICROSCOPIC
BILIRUBIN URINE: NEGATIVE
Glucose, UA: NEGATIVE mg/dL
HGB URINE DIPSTICK: NEGATIVE
Ketones, ur: NEGATIVE mg/dL
Leukocytes, UA: NEGATIVE
Nitrite: NEGATIVE
Protein, ur: NEGATIVE mg/dL
SPECIFIC GRAVITY, URINE: 1.016 (ref 1.005–1.030)
pH: 7 (ref 5.0–8.0)

## 2015-02-14 LAB — CBC
HCT: 37.4 % — ABNORMAL LOW (ref 39.0–52.0)
HEMOGLOBIN: 13 g/dL (ref 13.0–17.0)
MCH: 26.7 pg (ref 26.0–34.0)
MCHC: 34.8 g/dL (ref 30.0–36.0)
MCV: 77 fL — ABNORMAL LOW (ref 78.0–100.0)
Platelets: 610 10*3/uL — ABNORMAL HIGH (ref 150–400)
RBC: 4.86 MIL/uL (ref 4.22–5.81)
RDW: 12.7 % (ref 11.5–15.5)
WBC: 15.4 10*3/uL — ABNORMAL HIGH (ref 4.0–10.5)

## 2015-02-14 LAB — LIPASE, BLOOD: Lipase: 29 U/L (ref 11–51)

## 2015-02-14 MED ORDER — ONDANSETRON 4 MG PO TBDP
4.0000 mg | ORAL_TABLET | Freq: Once | ORAL | Status: AC | PRN
  Administered 2015-02-14: 4 mg via ORAL

## 2015-02-14 MED ORDER — ONDANSETRON 4 MG PO TBDP
ORAL_TABLET | ORAL | Status: AC
  Filled 2015-02-14: qty 1

## 2015-02-14 NOTE — ED Notes (Signed)
Pt took milk of magnesium yesterday and has had multiple loose stools since with abd pain.  Pt speaks spanish, spanish interpreter used for translation.

## 2015-02-14 NOTE — ED Notes (Signed)
Pt c/o weakness, nausea and vomiting starting today.

## 2015-02-14 NOTE — ED Notes (Signed)
Pt had an appendix removal in November, states he had surgery last week because of infection, drain in place.

## 2015-02-15 ENCOUNTER — Inpatient Hospital Stay (HOSPITAL_COMMUNITY)
Admission: EM | Admit: 2015-02-15 | Discharge: 2015-02-23 | DRG: 373 | Disposition: A | Discharge status: Home / Self Care | Payer: Self-pay | Attending: General Surgery | Admitting: General Surgery

## 2015-02-15 ENCOUNTER — Encounter (HOSPITAL_COMMUNITY): Payer: Self-pay

## 2015-02-15 ENCOUNTER — Inpatient Hospital Stay (HOSPITAL_COMMUNITY): Payer: Self-pay

## 2015-02-15 ENCOUNTER — Emergency Department (HOSPITAL_COMMUNITY): Payer: Self-pay

## 2015-02-15 DIAGNOSIS — IMO0002 Reserved for concepts with insufficient information to code with codable children: Secondary | ICD-10-CM

## 2015-02-15 DIAGNOSIS — K651 Peritoneal abscess: Secondary | ICD-10-CM

## 2015-02-15 DIAGNOSIS — R188 Other ascites: Secondary | ICD-10-CM

## 2015-02-15 HISTORY — DX: Cutaneous abscess, unspecified: L02.91

## 2015-02-15 LAB — CBC
HEMATOCRIT: 33.8 % — AB (ref 39.0–52.0)
Hemoglobin: 11.4 g/dL — ABNORMAL LOW (ref 13.0–17.0)
MCH: 26.3 pg (ref 26.0–34.0)
MCHC: 33.7 g/dL (ref 30.0–36.0)
MCV: 77.9 fL — AB (ref 78.0–100.0)
Platelets: 538 10*3/uL — ABNORMAL HIGH (ref 150–400)
RBC: 4.34 MIL/uL (ref 4.22–5.81)
RDW: 12.7 % (ref 11.5–15.5)
WBC: 13.3 10*3/uL — ABNORMAL HIGH (ref 4.0–10.5)

## 2015-02-15 LAB — CREATININE, SERUM
Creatinine, Ser: 0.75 mg/dL (ref 0.61–1.24)
GFR calc Af Amer: >60 mL/min (ref >60)
GFR calc non Af Amer: >60 mL/min (ref >60)

## 2015-02-15 MED ORDER — MORPHINE SULFATE (PF) 2 MG/ML IV SOLN
2.0000 mg | INTRAVENOUS | Status: DC | PRN
  Administered 2015-02-15 (×2): 2 mg via INTRAVENOUS
  Filled 2015-02-15 (×2): qty 1

## 2015-02-15 MED ORDER — ONDANSETRON 4 MG PO TBDP: 4.0000 mg | ORAL_TABLET | Freq: Four times a day (QID) | ORAL | Status: DC | PRN

## 2015-02-15 MED ORDER — ACETAMINOPHEN 325 MG PO TABS: 650.0000 mg | ORAL_TABLET | Freq: Four times a day (QID) | ORAL | Status: DC | PRN

## 2015-02-15 MED ORDER — SODIUM CHLORIDE 0.9 % IV SOLN
INTRAVENOUS | Status: AC | PRN
  Administered 2015-02-15: 10 mL/h via INTRAVENOUS

## 2015-02-15 MED ORDER — ENOXAPARIN SODIUM 40 MG/0.4ML ~~LOC~~ SOLN
40.0000 mg | Freq: Every day | SUBCUTANEOUS | Status: DC
  Administered 2015-02-16 – 2015-02-18 (×3): 40 mg via SUBCUTANEOUS
  Filled 2015-02-15 (×7): qty 0.4

## 2015-02-15 MED ORDER — MIDAZOLAM HCL 2 MG/2ML IJ SOLN
INTRAMUSCULAR | Status: AC
  Filled 2015-02-15: qty 4

## 2015-02-15 MED ORDER — OXYCODONE HCL 5 MG PO TABS
5.0000 mg | ORAL_TABLET | Freq: Four times a day (QID) | ORAL | Status: DC | PRN
  Filled 2015-02-15: qty 1
  Filled 2015-02-15: qty 2

## 2015-02-15 MED ORDER — MORPHINE SULFATE (PF) 4 MG/ML IV SOLN
4.0000 mg | Freq: Once | INTRAVENOUS | Status: AC
  Administered 2015-02-15: 4 mg via INTRAVENOUS
  Filled 2015-02-15: qty 1

## 2015-02-15 MED ORDER — SODIUM CHLORIDE 0.9 % IV SOLN
1.0000 g | Freq: Every day | INTRAVENOUS | Status: DC
  Administered 2015-02-15 – 2015-02-20 (×6): 1 g via INTRAVENOUS
  Filled 2015-02-15 (×6): qty 1

## 2015-02-15 MED ORDER — SODIUM CHLORIDE 0.9 % IV BOLUS (SEPSIS)
1000.0000 mL | Freq: Once | INTRAVENOUS | Status: AC
  Administered 2015-02-15: 1000 mL via INTRAVENOUS

## 2015-02-15 MED ORDER — ENOXAPARIN SODIUM 40 MG/0.4ML ~~LOC~~ SOLN
40.0000 mg | Freq: Every day | SUBCUTANEOUS | Status: DC
  Filled 2015-02-15: qty 0.4

## 2015-02-15 MED ORDER — FENTANYL CITRATE (PF) 100 MCG/2ML IJ SOLN
INTRAMUSCULAR | Status: AC
  Filled 2015-02-15: qty 4

## 2015-02-15 MED ORDER — LIDOCAINE HCL 1 % IJ SOLN
INTRAMUSCULAR | Status: AC
  Filled 2015-02-15: qty 20

## 2015-02-15 MED ORDER — DIPHENHYDRAMINE HCL 50 MG/ML IJ SOLN: 25.0000 mg | Freq: Four times a day (QID) | INTRAMUSCULAR | Status: DC | PRN

## 2015-02-15 MED ORDER — ONDANSETRON HCL 4 MG/2ML IJ SOLN: 4.0000 mg | Freq: Four times a day (QID) | INTRAMUSCULAR | Status: DC | PRN

## 2015-02-15 MED ORDER — INFLUENZA VAC SPLIT QUAD 0.5 ML IM SUSY
0.5000 mL | PREFILLED_SYRINGE | INTRAMUSCULAR | Status: AC
  Administered 2015-02-16: 0.5 mL via INTRAMUSCULAR
  Filled 2015-02-15: qty 0.5

## 2015-02-15 MED ORDER — DIPHENHYDRAMINE HCL 25 MG PO CAPS: 25.0000 mg | ORAL_CAPSULE | Freq: Four times a day (QID) | ORAL | Status: DC | PRN

## 2015-02-15 MED ORDER — METHOCARBAMOL 500 MG PO TABS: 500.0000 mg | ORAL_TABLET | Freq: Four times a day (QID) | ORAL | Status: DC | PRN

## 2015-02-15 MED ORDER — MIDAZOLAM HCL 2 MG/2ML IJ SOLN
INTRAMUSCULAR | Status: AC | PRN
  Administered 2015-02-15: 0.5 mg via INTRAVENOUS
  Administered 2015-02-15 (×2): 1 mg via INTRAVENOUS
  Administered 2015-02-15: 0.5 mg via INTRAVENOUS

## 2015-02-15 MED ORDER — PANTOPRAZOLE SODIUM 40 MG IV SOLR
40.0000 mg | Freq: Every day | INTRAVENOUS | Status: DC
  Administered 2015-02-15 – 2015-02-19 (×5): 40 mg via INTRAVENOUS
  Filled 2015-02-15 (×5): qty 40

## 2015-02-15 MED ORDER — ACETAMINOPHEN 650 MG RE SUPP: 650.0000 mg | Freq: Four times a day (QID) | RECTAL | Status: DC | PRN

## 2015-02-15 MED ORDER — IOHEXOL 300 MG/ML  SOLN
100.0000 mL | Freq: Once | INTRAMUSCULAR | Status: AC | PRN
  Administered 2015-02-15: 100 mL via INTRAVENOUS

## 2015-02-15 MED ORDER — FENTANYL CITRATE (PF) 100 MCG/2ML IJ SOLN
INTRAMUSCULAR | Status: AC | PRN
  Administered 2015-02-15: 50 ug via INTRAVENOUS
  Administered 2015-02-15 (×2): 25 ug via INTRAVENOUS
  Administered 2015-02-15: 50 ug via INTRAVENOUS

## 2015-02-15 MED ORDER — ONDANSETRON HCL 4 MG/2ML IJ SOLN
4.0000 mg | Freq: Once | INTRAMUSCULAR | Status: AC
  Administered 2015-02-15: 4 mg via INTRAVENOUS
  Filled 2015-02-15: qty 2

## 2015-02-15 NOTE — Progress Notes (Signed)
Pt admitted to 6N20 via stretcher from PACU.  Pt AAO X4.  Pt on RA.  Pt has 20G to [facility] with fluids infusing.  Pt has JP drain to RLQ with fry dressing and JP drain to pelvic area with dry dressing.  Pt has FC in place draining clear, yellow urine.  Pt has no complaints at the moment.  Will continue to monitor.

## 2015-02-15 NOTE — Sedation Documentation (Signed)
Patient denies pain and is resting comfortably.  

## 2015-02-15 NOTE — Progress Notes (Signed)
Patient ID: Dale Crane, male   DOB: 29-May-1985, 30 y.o.   MRN: [id number]    Referring Physician(s): CCS  Chief Complaint:  Pelvic abscess  Subjective:  Patient with prior history of acute appendicitis which was complicated by perihepatic post appendectomy abscess with associated migrated appendicolith. He underwent percutaneous drain placement by IR on 12/26/14. On 12/13/14 he underwent diagnostic laparoscopy with wash out of the abscess and removal of fecalith. A surgical drain was left in place. He presents again today with acute onset of generalized abdominal pain, nausea , vomiting and constipation. CT scan today has revealed a new peripherally enhancing abscess within the upper pelvis and small mesenteric abscess in mid abdominal region. The recently noted complex abscess within the right posterior inferior  perihepatic space has resolved with drain in place. Request now received from surgery for pelvic abscess drainage.  Allergies: Review of patient's allergies indicates no known allergies.  Medications: Prior to Admission medications   Medication Sig Start Date End Date Taking? Authorizing Provider  amoxicillin-clavulanate (AUGMENTIN) 875-125 MG tablet Take 1 tablet by mouth 2 (two) times daily. 02/08/15  Yes Nonie Hoyer, PA-C  docusate sodium (COLACE) 100 MG capsule Take 1 capsule (100 mg total) by mouth 2 (two) times daily. 02/08/15  Yes Nonie Hoyer, PA-C  oxyCODONE (OXY IR/ROXICODONE) 5 MG immediate release tablet Take 1-2 tablets (5-10 mg total) by mouth every 6 (six) hours as needed for moderate pain or severe pain. 02/08/15  Yes Megan Meredith Leeds, PA-C  saccharomyces boulardii (FLORASTOR) 250 MG capsule Take 1 capsule (250 mg total) by mouth 2 (two) times daily. 02/08/15  Yes Nonie Hoyer, PA-C     Vital Signs: BP 109/66 mmHg  Pulse 81  Temp(Src) 97.7 F (36.5 C) (Oral)  Resp 19  SpO2 92%  Physical Exam patient awake, alert. Chest with clear breath sounds  bilaterally. Abdomen soft, few bowel sounds, right upper quadrant surgical drain intact draining dark bloody fluid, insertion site tender to palpation; extremities with no edema  Imaging: Ct Abdomen Pelvis W Contrast  02/15/2015  CLINICAL DATA:  Acute onset of generalized abdominal pain, nausea and vomiting. Constipation. Initial encounter. EXAM: CT ABDOMEN AND PELVIS WITH CONTRAST TECHNIQUE: Multidetector CT imaging of the abdomen and pelvis was performed using the standard protocol following bolus administration of intravenous contrast. CONTRAST:  OMNIPAQUE IOHEXOL 300 MG/ML  SOLN COMPARISON:  CT of the abdomen and pelvis from 02/02/2015 FINDINGS: Mild bibasilar atelectasis is noted. The previously noted complex abscess within the right posterior inferior perihepatic space has resolved, with associated drainage catheter in place. Trace residual fluid and apparent residual appendicolith are noted along the right paracolic gutter. There is a new 6.5 x 5.8 x 4.4 cm peripherally enhancing abscess within the upper pelvis, with surrounding soft tissue inflammation tracking about adjacent small and large bowel loops. Additional trace peripherally enhancing fluid collections are noted tracking anteriorly, superior to the bladder. Trace fluid is noted tracking superiorly along the mesentery. There also appears to be a small 1.2 cm mesenteric abscess at the mid abdomen, at the level of the kidneys, containing fluid and trace air. The liver and spleen are unremarkable in appearance. The gallbladder is within normal limits. The pancreas and adrenal glands are unremarkable. A 2 mm nonobstructing stone is noted near the lower pole of the right kidney. Trace right-sided perinephric fluid is noted. The kidneys are otherwise unremarkable. There is no evidence of hydronephrosis. No obstructing ureteral stones are seen. The more proximal  small bowel is grossly unremarkable. The stomach is within normal limits. No acute  vascular abnormalities are seen. The colon is otherwise unremarkable in appearance. The bladder is mildly distended. Mild surrounding soft tissue inflammation reflects the adjacent infectious process. The prostate is normal in size. No inguinal lymphadenopathy is seen. No acute osseous abnormalities are identified. IMPRESSION: 1. New 6.5 x 5.8 x 4.4 cm peripherally enhancing abscess within the upper pelvis, with surrounding soft tissue inflammation tracking about adjacent small and large bowel loops. Additional trace peripherally enhancing fluid collections noted tracking anteriorly, superior to the bladder. Given its central location, this may be difficult to drain. 2. Small 1.2 cm mesenteric abscess noted at the mid abdomen, at the level of the kidneys, containing fluid and trace air. 3. Trace fluid tracks superiorly along the mesentery. 4. Previously noted complex abscess within the right posterior inferior perihepatic space has resolved, with associated drainage catheter in place. Trace residual fluid and apparent residual appendicolith noted along the right paracolic gutter. 5. 2 mm nonobstructing stone near the lower pole of the right kidney. 6. Mild soft tissue inflammation about the bladder reflects the adjacent infectious process. These results were called by telephone at the time of interpretation on 02/15/2015 at 1:49 am to Dr. Jaci Carrel, who verbally acknowledged these results. Electronically Signed   By: Roanna Raider M.D.   On: 02/15/2015 01:52    Labs:  CBC:  Recent Labs  02/06/15 0418 02/07/15 0501 02/14/15 1948 02/15/15 0443  WBC 10.8* 10.4 15.4* 13.3*  HGB 11.2* 11.6* 13.0 11.4*  HCT 32.8* 34.1* 37.4* 33.8*  PLT 231 254 610* 538*    COAGS:  Recent Labs  12/20/14 0225  INR 1.15  APTT 30    BMP:  Recent Labs  02/03/15 0522 02/06/15 0418 02/07/15 0501 02/14/15 1948 02/15/15 0443  NA 134* 130* 132* 136  --   K 4.1 4.0 3.6 3.9  --   CL 99* 97* 98* 99*  --    CO2 --   GLUCOSE 113* 125* 125* 104*  --   BUN <5* <5* <5* <5*  --   CALCIUM 8.8* 8.2* 8.2* 9.3  --   CREATININE 0.69 0.51* 0.73 0.76 0.75  GFRNONAA >60 >60 >60 >60 >60  GFRAA >60 >60 >60 >60 >60    LIVER FUNCTION TESTS:  Recent Labs  12/22/14 0454 02/02/15 1040 02/03/15 0522 02/14/15 1948  BILITOT 0.3 1.1 1.0 0.5  AST 51* 23 20 45*  ALT 125* 24 23 67*  ALKPHOS 80 97 103 91  PROT 7.3 7.9 6.8 8.1  ALBUMIN 3.5 3.8 3.0* 3.4*    Assessment and Plan: Patient with recurrent intra-abdominal abscess, status post prior right perihepatic abscess drain on 12/26/14 as well as appendectomy on 12/13/14 followed by laparoscopic wash out on 02/04/15. Latest CT has been reviewed by Dr. Fredia Sorrow. Drainage of recurrent intra-abdominal/pelvic abscess has been requested by surgery. Details/risks of procedure, including but not limited to, internal bleeding, infection/sepsis, injury to adjacent organs, discussed with patient via interpreter with his understanding and consent. Procedure scheduled for today.   Electronically Signed: D. Jeananne Rama 02/15/2015, 10:03 AM   I spent a total of 20 minutes at the the patient's bedside AND on the patient's hospital floor or unit, greater than 50% of which was counseling/coordinating care for CT-guided pelvic abscess drainage

## 2015-02-15 NOTE — Progress Notes (Signed)
Interpreter [name] Namihira for Patient °

## 2015-02-15 NOTE — Procedures (Signed)
Interventional Radiology Procedure Note  Procedure:  CT guided percutaneous catheter drainage of left pelvic abscess  Complications:  None  Estimated Blood Loss: < 10 mL  Left pelvic peritoneal collection yielded bloody pus.  Sample sent for culture.  10 Fr drain placed and attached to suction bulb. Will follow drain output.  Jodi Marble. Fredia Sorrow, M.D Pager:  [phone]

## 2015-02-15 NOTE — Progress Notes (Signed)
Patient ID: Dale Crane, male   DOB: Jun 19, 1985, 30 y.o.   MRN: [id number]    Subjective: Pt has LLQ pain, but otherwise feeling weak right now.  Looks better than when I saw him last week.  Objective: Vital signs in last 24 hours: Temp:  [97.7 F (36.5 C)-99.5 F (37.5 C)] 97.7 F (36.5 C) (01/20 0020) Pulse Rate:  [73-114] 81 (01/20 0900) Resp:  [11-21] 14 (01/20 0900) BP: (94-123)/(50-77) 100/59 mmHg (01/20 0900) SpO2:  [86 %-98 %] 92 % (01/20 0900)    Intake/Output from previous day:   Intake/Output this shift:    PE: Abd: soft, tender in LLQ, and some in RLQ around incisions and drain.  Drain with dark old blood present, present BS Heart: regular Lungs: CTAB  Lab Results:   Recent Labs  02/14/15 1948 02/15/15 0443  WBC 15.4* 13.3*  HGB 13.0 11.4*  HCT 37.4* 33.8*  PLT 610* 538*   BMET  Recent Labs  02/14/15 1948 02/15/15 0443  NA 136  --   K 3.9  --   CL 99*  --   CO2 27  --   GLUCOSE 104*  --   BUN <5*  --   CREATININE 0.76 0.75  CALCIUM 9.3  --    PT/INR No results for input(s): LABPROT, INR in the last 72 hours. CMP     Component Value Date/Time   NA 136 02/14/2015 1948   K 3.9 02/14/2015 1948   CL 99* 02/14/2015 1948   CO2 27 02/14/2015 1948   GLUCOSE 104* 02/14/2015 1948   BUN <5* 02/14/2015 1948   CREATININE 0.75 02/15/2015 0443   CALCIUM 9.3 02/14/2015 1948   PROT 8.1 02/14/2015 1948   ALBUMIN 3.4* 02/14/2015 1948   AST 45* 02/14/2015 1948   ALT 67* 02/14/2015 1948   ALKPHOS 91 02/14/2015 1948   BILITOT 0.5 02/14/2015 1948   GFRNONAA >60 02/15/2015 0443   GFRAA >60 02/15/2015 0443   Lipase     Component Value Date/Time   LIPASE 29 02/14/2015 1948       Studies/Results: Ct Abdomen Pelvis W Contrast  02/15/2015  CLINICAL DATA:  Acute onset of generalized abdominal pain, nausea and vomiting. Constipation. Initial encounter. EXAM: CT ABDOMEN AND PELVIS WITH CONTRAST TECHNIQUE: Multidetector CT imaging of the  abdomen and pelvis was performed using the standard protocol following bolus administration of intravenous contrast. CONTRAST:  OMNIPAQUE IOHEXOL 300 MG/ML  SOLN COMPARISON:  CT of the abdomen and pelvis from 02/02/2015 FINDINGS: Mild bibasilar atelectasis is noted. The previously noted complex abscess within the right posterior inferior perihepatic space has resolved, with associated drainage catheter in place. Trace residual fluid and apparent residual appendicolith are noted along the right paracolic gutter. There is a new 6.5 x 5.8 x 4.4 cm peripherally enhancing abscess within the upper pelvis, with surrounding soft tissue inflammation tracking about adjacent small and large bowel loops. Additional trace peripherally enhancing fluid collections are noted tracking anteriorly, superior to the bladder. Trace fluid is noted tracking superiorly along the mesentery. There also appears to be a small 1.2 cm mesenteric abscess at the mid abdomen, at the level of the kidneys, containing fluid and trace air. The liver and spleen are unremarkable in appearance. The gallbladder is within normal limits. The pancreas and adrenal glands are unremarkable. A 2 mm nonobstructing stone is noted near the lower pole of the right kidney. Trace right-sided perinephric fluid is noted. The kidneys are otherwise unremarkable. There is no evidence  of hydronephrosis. No obstructing ureteral stones are seen. The more proximal small bowel is grossly unremarkable. The stomach is within normal limits. No acute vascular abnormalities are seen. The colon is otherwise unremarkable in appearance. The bladder is mildly distended. Mild surrounding soft tissue inflammation reflects the adjacent infectious process. The prostate is normal in size. No inguinal lymphadenopathy is seen. No acute osseous abnormalities are identified. IMPRESSION: 1. New 6.5 x 5.8 x 4.4 cm peripherally enhancing abscess within the upper pelvis, with surrounding soft  tissue inflammation tracking about adjacent small and large bowel loops. Additional trace peripherally enhancing fluid collections noted tracking anteriorly, superior to the bladder. Given its central location, this may be difficult to drain. 2. Small 1.2 cm mesenteric abscess noted at the mid abdomen, at the level of the kidneys, containing fluid and trace air. 3. Trace fluid tracks superiorly along the mesentery. 4. Previously noted complex abscess within the right posterior inferior perihepatic space has resolved, with associated drainage catheter in place. Trace residual fluid and apparent residual appendicolith noted along the right paracolic gutter. 5. 2 mm nonobstructing stone near the lower pole of the right kidney. 6. Mild soft tissue inflammation about the bladder reflects the adjacent infectious process. These results were called by telephone at the time of interpretation on 02/15/2015 at 1:49 am to Dr. Jaci Carrel, who verbally acknowledged these results. Electronically Signed   By: Roanna Raider M.D.   On: 02/15/2015 01:52    Anti-infectives: Anti-infectives    Start     Dose/Rate Route Frequency Ordered Stop   02/15/15 0500  ertapenem (INVANZ) 1 g in sodium chloride 0.9 % 50 mL IVPB     1 g 100 mL/hr over 30 Minutes Intravenous Daily 02/15/15 0404         Assessment/Plan  1. POD 11, S/p diagnostic laparoscopy with washout of abscess and removal of fecalith  -recurrent intra-abdominal abscess in LLQ.  D/W Dr. Fredia Sorrow.  There is a window and they will plan for drain placement this afternoon.  He will need a foley placed in order to decompress the bladder for the procedure -remain NPO, hold lovenox for today -cont Invanz D1. -all of this discussed with the patient via a translator.    LOS: 0 days    Dale Crane 02/15/2015, 9:31 AM Pager: [phone]

## 2015-02-15 NOTE — Sedation Documentation (Signed)
Patient is resting comfortably. 

## 2015-02-15 NOTE — ED Notes (Signed)
Pt to IR

## 2015-02-15 NOTE — ED Provider Notes (Signed)
CSN: [id number]     Arrival date & time 02/14/15  1908 History  By signing my name below, I, [name], attest that this documentation has been prepared under the direction and in the presence of Gilda Crease, MD. Electronically Signed: [name], ED Scribe. 02/15/2015. 12:38 AM.    Chief Complaint  Patient presents with  . Weakness  . Emesis   The history is provided by the patient and a relative. No language interpreter was used.   HPI Comments: Dale Crane is a 30 y.o. male with a PSHx of laparoscopy diagnostic and washout 11 days ago who presents to the Emergency Department complaining of weakness and emesis onset today. He reports associated diffuse abdominal pain, as well as constipation for the past day. Per relative, pt is unable to keep down food and vomits immediately after eating. Pt denies diarrhea.   Past Medical History  Diagnosis Date  . Kidney stones    Past Surgical History  Procedure Laterality Date  . Appendectomy    . Laparoscopic appendectomy N/A 12/13/2014    Procedure: APPENDECTOMY LAPAROSCOPIC FOR RUPTURED APPENDIX; LAPAROSCOPIC LYSIS OF ADHESIONS 45 minutes;  Surgeon: Violeta Gelinas, MD;  Location: [facility] OR;  Service: General;  Laterality: N/A;  . Laparoscopy N/A 02/04/2015    Procedure: LAPAROSCOPY DIAGNOSTIC AND WASHOUT;  Surgeon: Axel Filler, MD;  [facility];  Service: General;  Laterality: N/A;   No family history on file. Social History  Substance Use Topics  . Smoking status: Never Smoker   . Smokeless tobacco: None  . Alcohol Use: Yes     Comment: sometimes    Review of Systems  Gastrointestinal: Positive for nausea, vomiting, abdominal pain and constipation. Negative for diarrhea.  All other systems reviewed and are negative.   Allergies  Review of patient's allergies indicates no known allergies.  Home Medications   Prior to Admission medications   Medication Sig Start Date End Date Taking? Authorizing  Provider  amoxicillin-clavulanate (AUGMENTIN) 875-125 MG tablet Take 1 tablet by mouth 2 (two) times daily. 02/08/15  Yes Nonie Hoyer, PA-C  docusate sodium (COLACE) 100 MG capsule Take 1 capsule (100 mg total) by mouth 2 (two) times daily. 02/08/15  Yes Nonie Hoyer, PA-C  oxyCODONE (OXY IR/ROXICODONE) 5 MG immediate release tablet Take 1-2 tablets (5-10 mg total) by mouth every 6 (six) hours as needed for moderate pain or severe pain. 02/08/15  Yes Megan Meredith Leeds, PA-C  saccharomyces boulardii (FLORASTOR) 250 MG capsule Take 1 capsule (250 mg total) by mouth 2 (two) times daily. 02/08/15  Yes Megan N Baird, PA-C   BP 111/69 mmHg  Pulse 103  Temp(Src) 97.7 F (36.5 C) (Oral)  Resp 21  SpO2 98% Physical Exam  Constitutional: He is oriented to person, place, and time. He appears well-developed and well-nourished. No distress.  HENT:  Head: Normocephalic and atraumatic.  Right Ear: Hearing normal.  Left Ear: Hearing normal.  Nose: Nose normal.  Mouth/Throat: Oropharynx is clear and moist and mucous membranes are normal.  Eyes: Conjunctivae and EOM are normal. Pupils are equal, round, and reactive to light.  Neck: Normal range of motion. Neck supple.  Cardiovascular: Regular rhythm, S1 normal and S2 normal.  Exam reveals no gallop and no friction rub.   No murmur heard. Pulmonary/Chest: Effort normal and breath sounds normal. No respiratory distress. He exhibits no tenderness.  Abdominal: Soft. Normal appearance and bowel sounds are normal. There is no hepatosplenomegaly. There is tenderness. There is no  rebound, no guarding, no tenderness at McBurney's point and negative Murphy's sign. No hernia.  Drain in R lower abdomen with small amount of bloody drainage. Diffusely TTP, worst TTP in epigastric area  Musculoskeletal: Normal range of motion.  Neurological: He is alert and oriented to person, place, and time. He has normal strength. No cranial nerve deficit or sensory deficit. Coordination  normal. GCS eye subscore is 4. GCS verbal subscore is 5. GCS motor subscore is 6.  Skin: Skin is warm, dry and intact. No rash noted. No cyanosis.  Psychiatric: He has a normal mood and affect. His speech is normal and behavior is normal. Thought content normal.  Nursing note and vitals reviewed.   ED Course  Procedures  DIAGNOSTIC STUDIES: Oxygen Saturation is 96% on RA, adequate by my interpretation.    COORDINATION OF CARE: 12:33 AM - Discussed lab results. Discussed plans to order medication for pain and nausea, IV fluids, as well as diagnostic imaging. Pt advised of plan for treatment and pt agrees.  Labs Review Labs Reviewed  COMPREHENSIVE METABOLIC PANEL - Abnormal; Notable for the following:    Chloride 99 (*)    Glucose, Bld 104 (*)    BUN <5 (*)    Albumin 3.4 (*)    AST 45 (*)    ALT 67 (*)    All other components within normal limits  CBC - Abnormal; Notable for the following:    WBC 15.4 (*)    HCT 37.4 (*)    MCV 77.0 (*)    Platelets 610 (*)    All other components within normal limits  LIPASE, BLOOD  URINALYSIS, ROUTINE W REFLEX MICROSCOPIC (NOT AT [facility])    Imaging Review Ct Abdomen Pelvis W Contrast  02/15/2015  CLINICAL DATA:  Acute onset of generalized abdominal pain, nausea and vomiting. Constipation. Initial encounter. EXAM: CT ABDOMEN AND PELVIS WITH CONTRAST TECHNIQUE: Multidetector CT imaging of the abdomen and pelvis was performed using the standard protocol following bolus administration of intravenous contrast. CONTRAST:  OMNIPAQUE IOHEXOL 300 MG/ML  SOLN COMPARISON:  CT of the abdomen and pelvis from 02/02/2015 FINDINGS: Mild bibasilar atelectasis is noted. The previously noted complex abscess within the right posterior inferior perihepatic space has resolved, with associated drainage catheter in place. Trace residual fluid and apparent residual appendicolith are noted along the right paracolic gutter. There is a new 6.5 x 5.8 x 4.4 cm  peripherally enhancing abscess within the upper pelvis, with surrounding soft tissue inflammation tracking about adjacent small and large bowel loops. Additional trace peripherally enhancing fluid collections are noted tracking anteriorly, superior to the bladder. Trace fluid is noted tracking superiorly along the mesentery. There also appears to be a small 1.2 cm mesenteric abscess at the mid abdomen, at the level of the kidneys, containing fluid and trace air. The liver and spleen are unremarkable in appearance. The gallbladder is within normal limits. The pancreas and adrenal glands are unremarkable. A 2 mm nonobstructing stone is noted near the lower pole of the right kidney. Trace right-sided perinephric fluid is noted. The kidneys are otherwise unremarkable. There is no evidence of hydronephrosis. No obstructing ureteral stones are seen. The more proximal small bowel is grossly unremarkable. The stomach is within normal limits. No acute vascular abnormalities are seen. The colon is otherwise unremarkable in appearance. The bladder is mildly distended. Mild surrounding soft tissue inflammation reflects the adjacent infectious process. The prostate is normal in size. No inguinal lymphadenopathy is seen. No acute osseous abnormalities  are identified. IMPRESSION: 1. New 6.5 x 5.8 x 4.4 cm peripherally enhancing abscess within the upper pelvis, with surrounding soft tissue inflammation tracking about adjacent small and large bowel loops. Additional trace peripherally enhancing fluid collections noted tracking anteriorly, superior to the bladder. Given its central location, this may be difficult to drain. 2. Small 1.2 cm mesenteric abscess noted at the mid abdomen, at the level of the kidneys, containing fluid and trace air. 3. Trace fluid tracks superiorly along the mesentery. 4. Previously noted complex abscess within the right posterior inferior perihepatic space has resolved, with associated drainage catheter in  place. Trace residual fluid and apparent residual appendicolith noted along the right paracolic gutter. 5. 2 mm nonobstructing stone near the lower pole of the right kidney. 6. Mild soft tissue inflammation about the bladder reflects the adjacent infectious process. These results were called by telephone at the time of interpretation on 02/15/2015 at 1:49 am to Dr. Jaci Carrel, who verbally acknowledged these results. Electronically Signed   By: Roanna Raider M.D.   On: 02/15/2015 01:52   I have personally reviewed and evaluated these images and lab results as part of my medical decision-making.   EKG Interpretation None      MDM   Final diagnoses:  None   intra-abdominal abscess  Presents to the emergency department for evaluation of abdominal pain with nausea and vomiting. He reports no bowel movement for 2 days. Patient has a complex recent history. Patient had ruptured appendicitis followed by abdominal abscess treated with repeat surgery. Lab work reveals leukocytosis. CT scan shows a new 6.5 cm abscess in the upper pelvis as well as a small mesenteric abscess in the mid abdomen.  Case discussed with Dr. Janee Morn, general surgery. He will see the patient for admission.  I personally performed the services described in this documentation, which was scribed in my presence. The recorded information has been reviewed and is accurate.    Gilda Crease, MD 02/15/15 [phone]

## 2015-02-15 NOTE — ED Notes (Signed)
IR at bedside receiving consent. Pt aware need for foley for procedure.

## 2015-02-15 NOTE — Sedation Documentation (Signed)
Pt grimacing and moaning, additional medication given as discussed with Dr. Towanda Malkin

## 2015-02-15 NOTE — Care Management Note (Signed)
Case Management Note  Patient Details  Name: Arlee Santosuosso MRN: [id number] Date of Birth: 10/28/1985  Subjective/Objective:                    Action/Plan: Able to provide [facility] letter on day of discharge for antibiotics . Please notify case management . If discharged on week day call [phone] , if discharged on weekend check amion for case management coverage .    Expected Discharge Date:                  Expected Discharge Plan:  Home/Self Care  In-House Referral:     Discharge planning Services  CM Consult, MATCH Program, Medication Assistance  Post Acute Care Choice:    Choice offered to:     DME Arranged:    DME Agency:     HH Arranged:    HH Agency:     Status of Service:  In process, will continue to follow  Medicare Important Message Given:    Date Medicare IM Given:    Medicare IM give by:    Date Additional Medicare IM Given:    Additional Medicare Important Message give by:     If discussed at Long Length of Stay Meetings, dates discussed:    Additional Comments:  Kingsley Plan, RN 02/15/2015, 3:26 PM

## 2015-02-15 NOTE — H&P (Signed)
Dale Crane is an 30 y.o. male.   Chief Complaint: Abdominal pain and anorexia HPI: Dale Crane is well known to our surgical service status post laparoscopic appendectomy on 12/13/2014. He had a postoperative abscess which was treated percutaneously. This failed to resolve and he underwent laparoscopic washout with removal of residual appendicolith on 02/04/2015 by Dr. Rosendo Gros. He still has a JP drain in place. He was recently discharged. He presented to the emergency department with diffuse abdominal pain and anorexia. Follow-up CT scan was obtained demonstrating a new abscess in his upper pelvis. Previous right sided abdominal abscess is much improved.  Past Medical History  Diagnosis Date  . Kidney stones     Past Surgical History  Procedure Laterality Date  . Appendectomy    . Laparoscopic appendectomy N/A 12/13/2014    Procedure: APPENDECTOMY LAPAROSCOPIC FOR RUPTURED APPENDIX; LAPAROSCOPIC LYSIS OF ADHESIONS 45 minutes;  Surgeon: Georganna Skeans, MD;  Location: [place];  Service: General;  Laterality: N/A;  . Laparoscopy N/A 02/04/2015    Procedure: LAPAROSCOPY DIAGNOSTIC AND WASHOUT;  Surgeon: Ralene Ok, MD;  Location: [place];  Service: General;  Laterality: N/A;    No family history on file. Social History:  reports that he has never smoked. He does not have any smokeless tobacco history on file. He reports that he drinks alcohol. He reports that he does not use illicit drugs.  Allergies: No Known Allergies   (Not in a hospital admission)  Results for orders placed or performed during the hospital encounter of 02/15/15 (from the past 48 hour(s))  Urinalysis, Routine w reflex microscopic (not at [facility])     Status: None   Collection Time: 02/14/15  7:42 PM  Result Value Ref Range   Color, Urine YELLOW YELLOW   APPearance CLEAR CLEAR   Specific Gravity, Urine 1.016 1.005 - 1.030   pH 7.0 5.0 - 8.0   Glucose, UA NEGATIVE NEGATIVE mg/dL   Hgb urine dipstick NEGATIVE  NEGATIVE   Bilirubin Urine NEGATIVE NEGATIVE   Ketones, ur NEGATIVE NEGATIVE mg/dL   Protein, ur NEGATIVE NEGATIVE mg/dL   Nitrite NEGATIVE NEGATIVE   Leukocytes, UA NEGATIVE NEGATIVE    Comment: MICROSCOPIC NOT DONE ON URINES WITH NEGATIVE PROTEIN, BLOOD, LEUKOCYTES, NITRITE, OR GLUCOSE <1000 mg/dL.  Lipase, blood     Status: None   Collection Time: 02/14/15  7:48 PM  Result Value Ref Range   Lipase 29 11 - 51 U/L  Comprehensive metabolic panel     Status: Abnormal   Collection Time: 02/14/15  7:48 PM  Result Value Ref Range   Sodium 136 135 - 145 mmol/L   Potassium 3.9 3.5 - 5.1 mmol/L   Chloride 99 (L) 101 - 111 mmol/L   CO2 27 22 - 32 mmol/L   Glucose, Bld 104 (H) 65 - 99 mg/dL   BUN <5 (L) 6 - 20 mg/dL   Creatinine, Ser 0.76 0.61 - 1.24 mg/dL   Calcium 9.3 8.9 - 10.3 mg/dL   Total Protein 8.1 6.5 - 8.1 g/dL   Albumin 3.4 (L) 3.5 - 5.0 g/dL   AST 45 (H) 15 - 41 U/L   ALT 67 (H) 17 - 63 U/L   Alkaline Phosphatase 91 38 - 126 U/L   Total Bilirubin 0.5 0.3 - 1.2 mg/dL   GFR calc non Af Amer >60 >60 mL/min   GFR calc Af Amer >60 >60 mL/min    Comment: (NOTE) The eGFR has been calculated using the CKD EPI equation. This calculation has  not been validated in all clinical situations. eGFR's persistently <60 mL/min signify possible Chronic Kidney Disease.    Anion gap 10 5 - 15  CBC     Status: Abnormal   Collection Time: 02/14/15  7:48 PM  Result Value Ref Range   WBC 15.4 (H) 4.0 - 10.5 K/uL   RBC 4.86 4.22 - 5.81 MIL/uL   Hemoglobin 13.0 13.0 - 17.0 g/dL   HCT 37.4 (L) 39.0 - 52.0 %   MCV 77.0 (L) 78.0 - 100.0 fL   MCH 26.7 26.0 - 34.0 pg   MCHC 34.8 30.0 - 36.0 g/dL   RDW 12.7 11.5 - 15.5 %   Platelets 610 (H) 150 - 400 K/uL   Ct Abdomen Pelvis W Contrast  02/15/2015  CLINICAL DATA:  Acute onset of generalized abdominal pain, nausea and vomiting. Constipation. Initial encounter. EXAM: CT ABDOMEN AND PELVIS WITH CONTRAST TECHNIQUE: Multidetector CT imaging of the  abdomen and pelvis was performed using the standard protocol following bolus administration of intravenous contrast. CONTRAST:  145m OMNIPAQUE IOHEXOL 300 MG/ML  SOLN COMPARISON:  CT of the abdomen and pelvis from 02/02/2015 FINDINGS: Mild bibasilar atelectasis is noted. The previously noted complex abscess within the right posterior inferior perihepatic space has resolved, with associated drainage catheter in place. Trace residual fluid and apparent residual appendicolith are noted along the right paracolic gutter. There is a new 6.5 x 5.8 x 4.4 cm peripherally enhancing abscess within the upper pelvis, with surrounding soft tissue inflammation tracking about adjacent small and large bowel loops. Additional trace peripherally enhancing fluid collections are noted tracking anteriorly, superior to the bladder. Trace fluid is noted tracking superiorly along the mesentery. There also appears to be a small 1.2 cm mesenteric abscess at the mid abdomen, at the level of the kidneys, containing fluid and trace air. The liver and spleen are unremarkable in appearance. The gallbladder is within normal limits. The pancreas and adrenal glands are unremarkable. A 2 mm nonobstructing stone is noted near the lower pole of the right kidney. Trace right-sided perinephric fluid is noted. The kidneys are otherwise unremarkable. There is no evidence of hydronephrosis. No obstructing ureteral stones are seen. The more proximal small bowel is grossly unremarkable. The stomach is within normal limits. No acute vascular abnormalities are seen. The colon is otherwise unremarkable in appearance. The bladder is mildly distended. Mild surrounding soft tissue inflammation reflects the adjacent infectious process. The prostate is normal in size. No inguinal lymphadenopathy is seen. No acute osseous abnormalities are identified. IMPRESSION: 1. New 6.5 x 5.8 x 4.4 cm peripherally enhancing abscess within the upper pelvis, with surrounding soft  tissue inflammation tracking about adjacent small and large bowel loops. Additional trace peripherally enhancing fluid collections noted tracking anteriorly, superior to the bladder. Given its central location, this may be difficult to drain. 2. Small 1.2 cm mesenteric abscess noted at the mid abdomen, at the level of the kidneys, containing fluid and trace air. 3. Trace fluid tracks superiorly along the mesentery. 4. Previously noted complex abscess within the right posterior inferior perihepatic space has resolved, with associated drainage catheter in place. Trace residual fluid and apparent residual appendicolith noted along the right paracolic gutter. 5. 2 mm nonobstructing stone near the lower pole of the right kidney. 6. Mild soft tissue inflammation about the bladder reflects the adjacent infectious process. These results were called by telephone at the time of interpretation on 02/15/2015 at 1:49 am to Dr. CJoseph Berkshire who verbally acknowledged these results. Electronically  Signed   By: Garald Balding M.D.   On: 02/15/2015 01:52    Review of Systems  Constitutional: Positive for malaise/fatigue.  HENT: Negative.   Eyes: Negative.   Respiratory: Negative.   Cardiovascular: Negative.   Gastrointestinal: Positive for nausea and abdominal pain.  Genitourinary: Negative.   Musculoskeletal: Negative.   Skin: Negative.   Neurological: Negative.   Endo/Heme/Allergies: Negative.   Psychiatric/Behavioral: Negative.     Blood pressure 111/69, pulse 103, temperature 97.7 F (36.5 C), temperature source Oral, resp. rate 21, SpO2 98 %. Physical Exam  Constitutional: He is oriented to person, place, and time. He appears well-developed and well-nourished. No distress.  HENT:  Head: Normocephalic and atraumatic.  Nose: Nose normal.  Mouth/Throat: Oropharynx is clear and moist.  Eyes: EOM are normal. Pupils are equal, round, and reactive to light.  Neck: Neck supple.  Cardiovascular: Normal  rate, normal heart sounds and intact distal pulses.   Respiratory: Effort normal and breath sounds normal.  GI: Soft. He exhibits no distension. There is tenderness. There is no rebound and no guarding.  JP drain right abdomen with dark bloody output, tenderness in the suprapubic region without guarding  Musculoskeletal: Normal range of motion.  Neurological: He is alert and oriented to person, place, and time.  Skin: Skin is warm.  Psychiatric: He has a normal mood and affect.     Assessment/Plan History of perforated appendicitis with multiple abscesses. Status post laparoscopic appendectomy and status post laparoscopic drainage of abscess. Now with new abscess in the upper pelvis. Will admit and place on IV antibiotics. Will discuss with radiology for possible percutaneous drainage. This is a frustrating problem and I discussed with him in detail.  Osha Rane E 02/15/2015, 2:26 AM

## 2015-02-16 LAB — CBC
HCT: 34.8 % — ABNORMAL LOW (ref 39.0–52.0)
Hemoglobin: 11.8 g/dL — ABNORMAL LOW (ref 13.0–17.0)
MCH: 26.5 pg (ref 26.0–34.0)
MCHC: 33.9 g/dL (ref 30.0–36.0)
MCV: 78 fL (ref 78.0–100.0)
PLATELETS: 559 10*3/uL — AB (ref 150–400)
RBC: 4.46 MIL/uL (ref 4.22–5.81)
RDW: 12.9 % (ref 11.5–15.5)
WBC: 9.7 10*3/uL (ref 4.0–10.5)

## 2015-02-16 LAB — BASIC METABOLIC PANEL
ANION GAP: 10 (ref 5–15)
CO2: 26 mmol/L (ref 22–32)
Calcium: 9 mg/dL (ref 8.9–10.3)
Chloride: 100 mmol/L — ABNORMAL LOW (ref 101–111)
Creatinine, Ser: 0.78 mg/dL (ref 0.61–1.24)
Glucose, Bld: 88 mg/dL (ref 65–99)
POTASSIUM: 4.2 mmol/L (ref 3.5–5.1)
SODIUM: 136 mmol/L (ref 135–145)

## 2015-02-16 MED ORDER — PRO-STAT SUGAR FREE PO LIQD
30.0000 mL | Freq: Two times a day (BID) | ORAL | Status: DC
  Administered 2015-02-16 – 2015-02-23 (×14): 30 mL via ORAL
  Filled 2015-02-16 (×14): qty 30

## 2015-02-16 MED ORDER — BOOST / RESOURCE BREEZE PO LIQD
1.0000 | Freq: Three times a day (TID) | ORAL | Status: DC
  Administered 2015-02-16 – 2015-02-17 (×4): 1 via ORAL

## 2015-02-16 NOTE — Progress Notes (Signed)
Subjective: Having bowel function, no complaints  Objective: Vital signs in last 24 hours: Temp:  [97.5 F (36.4 C)-98.6 F (37 C)] 97.6 F (36.4 C) (01/21 0451) Pulse Rate:  [82-108] 88 (01/21 0451) Resp:  [11-20] 18 (01/21 0451) BP: (103-114)/(58-73) 108/68 mmHg (01/21 0451) SpO2:  [96 %-100 %] 99 % (01/21 0451) Weight:  [84.1 kg (185 lb 6.5 oz)] 84.1 kg (185 lb 6.5 oz) (01/20 1241) Last BM Date: 02/14/15  Intake/Output from previous day: 01/20 0701 - 01/21 0700 In: 330 [P.O.:300] Out: 705 [Urine:575; Drains:130] Intake/Output this shift:    Resp: clear to auscultation bilaterally Cardio: regular rate and rhythm GI: two drains in place mostly serosang, soft nontender incision healed  Lab Results:   Recent Labs  02/15/15 0443 02/16/15 0614  WBC 13.3* 9.7  HGB 11.4* 11.8*  HCT 33.8* 34.8*  PLT 538* 559*   BMET  Recent Labs  02/14/15 1948 02/15/15 0443 02/16/15 0614  NA 136  --  136  K 3.9  --  4.2  CL 99*  --  100*  CO2 27  --  26  GLUCOSE 104*  --  88  BUN <5*  --  <5*  CREATININE 0.76 0.75 0.78  CALCIUM 9.3  --  9.0   PT/INR No results for input(s): LABPROT, INR in the last 72 hours. ABG No results for input(s): PHART, HCO3 in the last 72 hours.  Invalid input(s): PCO2, PO2  Studies/Results: Ct Abdomen Pelvis W Contrast  02/15/2015  CLINICAL DATA:  Acute onset of generalized abdominal pain, nausea and vomiting. Constipation. Initial encounter. EXAM: CT ABDOMEN AND PELVIS WITH CONTRAST TECHNIQUE: Multidetector CT imaging of the abdomen and pelvis was performed using the standard protocol following bolus administration of intravenous contrast. CONTRAST:  OMNIPAQUE IOHEXOL 300 MG/ML  SOLN COMPARISON:  CT of the abdomen and pelvis from 02/02/2015 FINDINGS: Mild bibasilar atelectasis is noted. The previously noted complex abscess within the right posterior inferior perihepatic space has resolved, with associated drainage catheter in place. Trace  residual fluid and apparent residual appendicolith are noted along the right paracolic gutter. There is a new 6.5 x 5.8 x 4.4 cm peripherally enhancing abscess within the upper pelvis, with surrounding soft tissue inflammation tracking about adjacent small and large bowel loops. Additional trace peripherally enhancing fluid collections are noted tracking anteriorly, superior to the bladder. Trace fluid is noted tracking superiorly along the mesentery. There also appears to be a small 1.2 cm mesenteric abscess at the mid abdomen, at the level of the kidneys, containing fluid and trace air. The liver and spleen are unremarkable in appearance. The gallbladder is within normal limits. The pancreas and adrenal glands are unremarkable. A 2 mm nonobstructing stone is noted near the lower pole of the right kidney. Trace right-sided perinephric fluid is noted. The kidneys are otherwise unremarkable. There is no evidence of hydronephrosis. No obstructing ureteral stones are seen. The more proximal small bowel is grossly unremarkable. The stomach is within normal limits. No acute vascular abnormalities are seen. The colon is otherwise unremarkable in appearance. The bladder is mildly distended. Mild surrounding soft tissue inflammation reflects the adjacent infectious process. The prostate is normal in size. No inguinal lymphadenopathy is seen. No acute osseous abnormalities are identified. IMPRESSION: 1. New 6.5 x 5.8 x 4.4 cm peripherally enhancing abscess within the upper pelvis, with surrounding soft tissue inflammation tracking about adjacent small and large bowel loops. Additional trace peripherally enhancing fluid collections noted tracking anteriorly, superior to the bladder. Given  its central location, this may be difficult to drain. 2. Small 1.2 cm mesenteric abscess noted at the mid abdomen, at the level of the kidneys, containing fluid and trace air. 3. Trace fluid tracks superiorly along the mesentery. 4.  Previously noted complex abscess within the right posterior inferior perihepatic space has resolved, with associated drainage catheter in place. Trace residual fluid and apparent residual appendicolith noted along the right paracolic gutter. 5. 2 mm nonobstructing stone near the lower pole of the right kidney. 6. Mild soft tissue inflammation about the bladder reflects the adjacent infectious process. These results were called by telephone at the time of interpretation on 02/15/2015 at 1:49 am to Dr. Jaci Carrel, who verbally acknowledged these results. Electronically Signed   By: Roanna Raider M.D.   On: 02/15/2015 01:52   Ct Image Guided Drainage By Percutaneous Catheter  02/15/2015  CLINICAL DATA:  Status post appendectomy with additional surgical removal of retained appendicolith. Development of new postoperative abscess in the left lower pelvis. EXAM: CT GUIDED DRAINAGE OF PELVIC PERITONEAL ABSCESS ANESTHESIA/SEDATION: 3.0 Mg IV Versed 150 mcg IV Fentanyl Total Moderate Sedation Time:  35 minutes. COMPARISON:  CT of the abdomen and pelvis performed earlier today. PROCEDURE: The procedure, risks, benefits, and alternatives were explained to the patient. Questions regarding the procedure were encouraged and answered. The patient understands and consents to the procedure. The left lower abdominal wall was prepped with chlorhexidine in a sterile fashion, and a sterile drape was applied covering the operative field. A sterile gown and sterile gloves were used for the procedure. Local anesthesia was provided with 1% Lidocaine. CT of the pelvis was performed in a supine position. Prior to the procedure a Foley catheter was placed to decompress the bladder. Under CT guidance, an 18 gauge trocar needle was advanced into the left pelvic abscess from an anterior approach. A fluid sample was aspirated and sent for culture analysis. A guidewire was advanced into the collection. A 10 French percutaneous drainage  catheter was then advanced over the wire. Catheter positioning was confirmed by CT. The catheter was then flushed and connected to a suction bulb. The catheter was secured at the skin with a Prolene retention suture and StatLock device. COMPLICATIONS: None FINDINGS: The lower pelvic abscess located just to the left of midline was accessed from a left anterior approach. Aspiration yielded bloody purulent fluid. After drain placement, there is excellent return of fluid from the abscess. IMPRESSION: CT-guided percutaneous drainage of left pelvic abscess located in the peritoneal cavity. Bloody purulent fluid was aspirated with a sample sent for culture analysis. A 10 French drain was placed and attached to suction bulb drainage. Electronically Signed   By: Irish Lack M.D.   On: 02/15/2015 17:25    Anti-infectives: Anti-infectives    Start     Dose/Rate Route Frequency Ordered Stop   02/15/15 0500  ertapenem (INVANZ) 1 g in sodium chloride 0.9 % 50 mL IVPB     1 g 100 mL/hr over 30 Minutes Intravenous Daily 02/15/15 0404        Assessment/Plan: POD 12 S/p diagnostic laparoscopy with washout of abscess and removal of fecalith  -drain placed, will follow -advance diet as tolerated -cont Invanz D2, await cultures, wbc now normal, will transition to oral abx soon, may be able to go home with drain next 48 hours -all of this discussed with the patient via a translator  [facility] 02/16/2015

## 2015-02-16 NOTE — Progress Notes (Signed)
Patient ID: Dale Crane, male   DOB: October 19, 1985, 30 y.o.   MRN: [id number]    Referring Physician(s): CCS  Chief Complaint:  Pelvic abscess  Subjective:  Pt doing fair; sore at drain sites; denies N/V; taking liquids ok  Allergies: Review of patient's allergies indicates no known allergies.  Medications: Prior to Admission medications   Medication Sig Start Date End Date Taking? Authorizing Provider  amoxicillin-clavulanate (AUGMENTIN) 875-125 MG tablet Take 1 tablet by mouth 2 (two) times daily. 02/08/15  Yes Nonie Hoyer, PA-C  docusate sodium (COLACE) 100 MG capsule Take 1 capsule (100 mg total) by mouth 2 (two) times daily. 02/08/15  Yes Nonie Hoyer, PA-C  oxyCODONE (OXY IR/ROXICODONE) 5 MG immediate release tablet Take 1-2 tablets (5-10 mg total) by mouth every 6 (six) hours as needed for moderate pain or severe pain. 02/08/15  Yes Megan Meredith Leeds, PA-C  saccharomyces boulardii (FLORASTOR) 250 MG capsule Take 1 capsule (250 mg total) by mouth 2 (two) times daily. 02/08/15  Yes Nonie Hoyer, PA-C     Vital Signs: BP 108/68 mmHg  Pulse 88  Temp(Src) 97.6 F (36.4 C) (Oral)  Resp 18  Ht  (1.702 m)  Wt 185 lb 6.5 oz (84.1 kg)  BMI 29.03 kg/m2  SpO2 99%  Physical Exam left pelvic drain intact, insertion site ok, mild-mod tender; output 80 cc blood-tinged turbid fluid, cx's pend  Imaging: Ct Abdomen Pelvis W Contrast  02/15/2015  CLINICAL DATA:  Acute onset of generalized abdominal pain, nausea and vomiting. Constipation. Initial encounter. EXAM: CT ABDOMEN AND PELVIS WITH CONTRAST TECHNIQUE: Multidetector CT imaging of the abdomen and pelvis was performed using the standard protocol following bolus administration of intravenous contrast. CONTRAST:  OMNIPAQUE IOHEXOL 300 MG/ML  SOLN COMPARISON:  CT of the abdomen and pelvis from 02/02/2015 FINDINGS: Mild bibasilar atelectasis is noted. The previously noted complex abscess within the right posterior inferior  perihepatic space has resolved, with associated drainage catheter in place. Trace residual fluid and apparent residual appendicolith are noted along the right paracolic gutter. There is a new 6.5 x 5.8 x 4.4 cm peripherally enhancing abscess within the upper pelvis, with surrounding soft tissue inflammation tracking about adjacent small and large bowel loops. Additional trace peripherally enhancing fluid collections are noted tracking anteriorly, superior to the bladder. Trace fluid is noted tracking superiorly along the mesentery. There also appears to be a small 1.2 cm mesenteric abscess at the mid abdomen, at the level of the kidneys, containing fluid and trace air. The liver and spleen are unremarkable in appearance. The gallbladder is within normal limits. The pancreas and adrenal glands are unremarkable. A 2 mm nonobstructing stone is noted near the lower pole of the right kidney. Trace right-sided perinephric fluid is noted. The kidneys are otherwise unremarkable. There is no evidence of hydronephrosis. No obstructing ureteral stones are seen. The more proximal small bowel is grossly unremarkable. The stomach is within normal limits. No acute vascular abnormalities are seen. The colon is otherwise unremarkable in appearance. The bladder is mildly distended. Mild surrounding soft tissue inflammation reflects the adjacent infectious process. The prostate is normal in size. No inguinal lymphadenopathy is seen. No acute osseous abnormalities are identified. IMPRESSION: 1. New 6.5 x 5.8 x 4.4 cm peripherally enhancing abscess within the upper pelvis, with surrounding soft tissue inflammation tracking about adjacent small and large bowel loops. Additional trace peripherally enhancing fluid collections noted tracking anteriorly, superior to the bladder. Given its central location, this  may be difficult to drain. 2. Small 1.2 cm mesenteric abscess noted at the mid abdomen, at the level of the kidneys, containing  fluid and trace air. 3. Trace fluid tracks superiorly along the mesentery. 4. Previously noted complex abscess within the right posterior inferior perihepatic space has resolved, with associated drainage catheter in place. Trace residual fluid and apparent residual appendicolith noted along the right paracolic gutter. 5. 2 mm nonobstructing stone near the lower pole of the right kidney. 6. Mild soft tissue inflammation about the bladder reflects the adjacent infectious process. These results were called by telephone at the time of interpretation on 02/15/2015 at 1:49 am to Dr. Jaci Carrel, who verbally acknowledged these results. Electronically Signed   By: Roanna Raider M.D.   On: 02/15/2015 01:52   Ct Image Guided Drainage By Percutaneous Catheter  02/15/2015  CLINICAL DATA:  Status post appendectomy with additional surgical removal of retained appendicolith. Development of new postoperative abscess in the left lower pelvis. EXAM: CT GUIDED DRAINAGE OF PELVIC PERITONEAL ABSCESS ANESTHESIA/SEDATION: 3.0 Mg IV Versed 150 mcg IV Fentanyl Total Moderate Sedation Time:  35 minutes. COMPARISON:  CT of the abdomen and pelvis performed earlier today. PROCEDURE: The procedure, risks, benefits, and alternatives were explained to the patient. Questions regarding the procedure were encouraged and answered. The patient understands and consents to the procedure. The left lower abdominal wall was prepped with chlorhexidine in a sterile fashion, and a sterile drape was applied covering the operative field. A sterile gown and sterile gloves were used for the procedure. Local anesthesia was provided with 1% Lidocaine. CT of the pelvis was performed in a supine position. Prior to the procedure a Foley catheter was placed to decompress the bladder. Under CT guidance, an 18 gauge trocar needle was advanced into the left pelvic abscess from an anterior approach. A fluid sample was aspirated and sent for culture analysis. A  guidewire was advanced into the collection. A 10 French percutaneous drainage catheter was then advanced over the wire. Catheter positioning was confirmed by CT. The catheter was then flushed and connected to a suction bulb. The catheter was secured at the skin with a Prolene retention suture and StatLock device. COMPLICATIONS: None FINDINGS: The lower pelvic abscess located just to the left of midline was accessed from a left anterior approach. Aspiration yielded bloody purulent fluid. After drain placement, there is excellent return of fluid from the abscess. IMPRESSION: CT-guided percutaneous drainage of left pelvic abscess located in the peritoneal cavity. Bloody purulent fluid was aspirated with a sample sent for culture analysis. A 10 French drain was placed and attached to suction bulb drainage. Electronically Signed   By: Irish Lack M.D.   On: 02/15/2015 17:25    Labs:  CBC:  Recent Labs  02/07/15 0501 02/14/15 1948 02/15/15 0443 02/16/15 0614  WBC 10.4 15.4* 13.3* 9.7  HGB 11.6* 13.0 11.4* 11.8*  HCT 34.1* 37.4* 33.8* 34.8*  PLT 254 610* 538* 559*    COAGS:  Recent Labs  12/20/14 0225  INR 1.15  APTT 30    BMP:  Recent Labs  02/06/15 0418 02/07/15 0501 02/14/15 1948 02/15/15 0443 02/16/15 0614  NA 130* 132* 136  --  136  K 4.0 3.6 3.9  --  4.2  CL 97* 98* 99*  --  100*  CO2 --  26  GLUCOSE 125* 125* 104*  --  88  BUN <5* <5* <5*  --  <5*  CALCIUM 8.2* 8.2*  9.3  --  9.0  CREATININE 0.51* 0.73 0.76 0.75 0.78  GFRNONAA >60 >60 >60 >60 >60  GFRAA >60 >60 >60 >60 >60    LIVER FUNCTION TESTS:  Recent Labs  12/22/14 0454 02/02/15 1040 02/03/15 0522 02/14/15 1948  BILITOT 0.3 1.1 1.0 0.5  AST 51* 23 20 45*  ALT 125* 24 23 67*  ALKPHOS 80 97 103 91  PROT 7.3 7.9 6.8 8.1  ALBUMIN 3.5 3.8 3.0* 3.4*    Assessment and Plan: S/p drainage of left pelvic abscess 1/20; AF; WBC/creat nl; hgb stable; check final fluid cx's; monitor output/labs;  cont drain irrigation; check f/u CT within 1 week of placement or sooner if clinical status dictates   Electronically Signed: D. Jeananne Rama 02/16/2015, 9:31 AM   I spent a total of 15 minutesatpt's bedside AND on the patient's hospital floor or unit, greater than 50% of which was counseling/coordinating care for left pelvic abscess drain

## 2015-02-16 NOTE — Progress Notes (Signed)
Flu vaccine administered as ordered.

## 2015-02-16 NOTE — Progress Notes (Signed)
Initial Nutrition Assessment   INTERVENTION:  Boost Breeze po TID, each supplement provides 250 kcal and 9 grams of protein   ProStat 30 ml TID (each 30 ml provides 100 kcal, 15 gr protein)   Advance diet as tolerated  NUTRITION DIAGNOSIS:  Inadequate oral intake related to abdominal abcess as evidenced by unplanned wt loss     GOAL: Pt to meet >/= 90% of their estimated nutrition needs      MONITOR: Diet advancement, po intake, labs and wt trends     REASON FOR ASSESSMENT:   Malnutrition Screening Tool    ASSESSMENT: Pt admitted in early January c/o abdominal pain. Per MD-he had appendectomy and November 2016 for perforated appendix. He had postoperative abscess along the right colon and right infrahepatic space treated with a percutaneous drain the last 7 weeks. He been developed more pain along his right abdomen without fever or chills. CT scan was obtained which showed recurrence of the abscess with a fecalith it's been present since November 2016.  Pt is POD 12 S/p diagnostic laparoscopy with washout of abscess and removal of fecalith . He is tolerating clear liquids today. Will add Boost Breeze and protein supplements to bridge nutrition intake while diet is advancing.  His weight is down significantly since November-14%.  Only able to complete partial NFPE. Suspect severe malnutrition in the context of acute illness but unable to confirm at this time.    Diet Order:  Diet clear liquid Room service appropriate?: Yes; Fluid consistency:: Thin  Skin:   abdominal incision, J-P drain  Last BM:   1/21  Height:   Ht Readings from Last 1 Encounters:  02/15/15  (1.702 m)    Weight:   Wt Readings from Last 1 Encounters:  02/15/15 185 lb 6.5 oz (84.1 kg)    Ideal Body Weight:  67.2 kg  BMI:  Body mass index is 29.03 kg/(m^2).  Estimated Nutritional Needs:   Kcal:  1610-9604  Protein:  101 gr  Fluid:  2.1-2.4 liters daily  EDUCATION NEEDS: none  identified at this time    Royann Shivers MS,RD,CSG,LDN Office: #[phone] Pager: [phone]

## 2015-02-17 NOTE — Progress Notes (Signed)
I pulled surgical JP drain without difficulty. Pt tolerated it well.

## 2015-02-17 NOTE — Progress Notes (Signed)
Subjective: Patient resting comfortably/ no complaints Right drain - virtually no output Left drain just placed by IR - 24 ml output  Objective: Vital signs in last 24 hours: Temp:  [97.4 F (36.3 C)-98.7 F (37.1 C)] 97.4 F (36.3 C) (01/22 0535) Pulse Rate:  [78-93] 78 (01/22 0535) Resp:  [16-17] 16 (01/22 0535) BP: (105-120)/(60-72) 105/72 mmHg (01/22 0535) SpO2:  [97 %-100 %] 100 % (01/22 0535) Last BM Date: 02/14/15  Intake/Output from previous day: 01/21 0701 - 01/22 0700 In: 770 [P.O.:760] Out: 1274 [Urine:1250; Drains:24] Intake/Output this shift:   Abd - soft, non-tender Right drain - minimal output; non-purulent Left drain with some cloudy fluid  Lab Results:   Recent Labs  02/15/15 0443 02/16/15 0614  WBC 13.3* 9.7  HGB 11.4* 11.8*  HCT 33.8* 34.8*  PLT 538* 559*   BMET  Recent Labs  02/14/15 1948 02/15/15 0443 02/16/15 0614  NA 136  --  136  K 3.9  --  4.2  CL 99*  --  100*  CO2 27  --  26  GLUCOSE 104*  --  88  BUN <5*  --  <5*  CREATININE 0.76 0.75 0.78  CALCIUM 9.3  --  9.0   PT/INR No results for input(s): LABPROT, INR in the last 72 hours. ABG No results for input(s): PHART, HCO3 in the last 72 hours.  Invalid input(s): PCO2, PO2  Studies/Results: Ct Image Guided Drainage By Percutaneous Catheter  02/15/2015  CLINICAL DATA:  Status post appendectomy with additional surgical removal of retained appendicolith. Development of new postoperative abscess in the left lower pelvis. EXAM: CT GUIDED DRAINAGE OF PELVIC PERITONEAL ABSCESS ANESTHESIA/SEDATION: 3.0 Mg IV Versed 150 mcg IV Fentanyl Total Moderate Sedation Time:  35 minutes. COMPARISON:  CT of the abdomen and pelvis performed earlier today. PROCEDURE: The procedure, risks, benefits, and alternatives were explained to the patient. Questions regarding the procedure were encouraged and answered. The patient understands and consents to the procedure. The left lower abdominal wall was  prepped with chlorhexidine in a sterile fashion, and a sterile drape was applied covering the operative field. A sterile gown and sterile gloves were used for the procedure. Local anesthesia was provided with 1% Lidocaine. CT of the pelvis was performed in a supine position. Prior to the procedure a Foley catheter was placed to decompress the bladder. Under CT guidance, an 18 gauge trocar needle was advanced into the left pelvic abscess from an anterior approach. A fluid sample was aspirated and sent for culture analysis. A guidewire was advanced into the collection. A 10 French percutaneous drainage catheter was then advanced over the wire. Catheter positioning was confirmed by CT. The catheter was then flushed and connected to a suction bulb. The catheter was secured at the skin with a Prolene retention suture and StatLock device. COMPLICATIONS: None FINDINGS: The lower pelvic abscess located just to the left of midline was accessed from a left anterior approach. Aspiration yielded bloody purulent fluid. After drain placement, there is excellent return of fluid from the abscess. IMPRESSION: CT-guided percutaneous drainage of left pelvic abscess located in the peritoneal cavity. Bloody purulent fluid was aspirated with a sample sent for culture analysis. A 10 French drain was placed and attached to suction bulb drainage. Electronically Signed   By: Irish Lack M.D.   On: 02/15/2015 17:25    Anti-infectives: Anti-infectives    Start     Dose/Rate Route Frequency Ordered Stop   02/15/15 0500  ertapenem (INVANZ) 1 g  in sodium chloride 0.9 % 50 mL IVPB     1 g 100 mL/hr over 30 Minutes Intravenous Daily 02/15/15 0404        Assessment/Plan: POD 13 S/p diagnostic laparoscopy with washout of abscess and removal of fecalith  -drain placed, will follow - Remove right upper quadrant drain - no output; no abscess on CT scan -advance diet as tolerated -cont Invanz D2, await cultures, wbc now normal, will  transition to oral abx soon, may be able to go home with drain next 48 hours  LOS: 2 days    Nalee Lightle K. 02/17/2015

## 2015-02-18 LAB — CULTURE, ROUTINE-ABSCESS: Special Requests: NORMAL

## 2015-02-18 NOTE — Progress Notes (Signed)
  Subjective: No real change, he has pain at the site of the drain, but not really having pain beyond that.  Surgical drain is out and site is fine.  Not much from the drain but what is in the bulb currently is purulent and has some red in it.    Objective: Vital signs in last 24 hours: Temp:  [97.6 F (36.4 C)-98.2 F (36.8 C)] 97.6 F (36.4 C) (01/23 0516) Pulse Rate:  [79-92] 79 (01/23 0516) Resp:  [16-18] 17 (01/23 0516) BP: (99-118)/(63-71) 107/63 mmHg (01/23 0516) SpO2:  [98 %-100 %] 98 % (01/23 0516) Last BM Date: 02/14/15 890 PO  He is on full liquids 22 from the drain Afebrile, VSS Labs on 02/16/15 were good. Perc drain CT on 02/15/15 Intake/Output from previous day: 01/22 0701 - 01/23 0700 In: 900 [P.O.:890] Out: 22 [Drains:22] Intake/Output this shift:    General appearance: alert, cooperative and no distress GI: soft, he is sore at the site of the drain, but denies any current abdominal pain and not taking anything for it.  Lab Results:   Recent Labs  02/16/15 0614  WBC 9.7  HGB 11.8*  HCT 34.8*  PLT 559*    BMET  Recent Labs  02/16/15 0614  NA 136  K 4.2  CL 100*  CO2 26  GLUCOSE 88  BUN <5*  CREATININE 0.78  CALCIUM 9.0   PT/INR No results for input(s): LABPROT, INR in the last 72 hours.   Recent Labs Lab 02/14/15 1948  AST 45*  ALT 67*  ALKPHOS 91  BILITOT 0.5  PROT 8.1  ALBUMIN 3.4*     Lipase     Component Value Date/Time   LIPASE 29 02/14/2015 1948     Studies/Results: No results found.  Medications: . enoxaparin (LOVENOX) injection  40 mg Subcutaneous Daily  . ertapenem  1 g Intravenous Daily  . feeding supplement  1 Container Oral TID BM  . feeding supplement (PRO-STAT SUGAR FREE 64)  30 mL Oral BID  . pantoprazole (PROTONIX) IV  40 mg Intravenous QHS   Specimen Description ABSCESS PELVIS   Special Requests Normal   Gram Stain ABUNDANT WBC PRESENT,BOTH PMN AND MONONUCLEAR  NO SQUAMOUS EPITHELIAL CELLS SEEN   ABUNDANT GRAM POSITIVE COCCI  IN PAIRS IN CLUSTERS ABUNDANT GRAM POSITIVE RODS  ABUNDANT GRAM NEGATIVE RODS       Culture NO GROWTH 1 DAY            Assessment/Plan Hx of perforated appendicitis, with prior 2 prior surgeries in [place] for perforated appendix. Appendectomy with lysis of adhesion 12/13/14 Dr. Violeta Gelinas Laparoscopy and washout 02/04/15 Dr. Derrell Lolling New abscess with drain placement by IR 02/15/15 Multiple CT guided aspirations for abscess since 11/2014 Antibiotics:  Day 4 Invanz DVT:  Lovenox/SCD   Plan:  Continue antibiotics, I will check on soft diet.  Current culture above.    LOS: 3 days    Selmer Adduci 02/18/2015

## 2015-02-18 NOTE — Progress Notes (Signed)
Referring Physician(s): CCS  Chief Complaint:  Pelvic abscess  Subjective:  Drain  Placed 1/20 in IR Output 25 cc yesterday Bloody/purulent Feels some better  Allergies: Review of patient's allergies indicates no known allergies.  Medications: Prior to Admission medications   Medication Sig Start Date End Date Taking? Authorizing Provider  amoxicillin-clavulanate (AUGMENTIN) 875-125 MG tablet Take 1 tablet by mouth 2 (two) times daily. 02/08/15  Yes Nonie Hoyer, PA-C  docusate sodium (COLACE) 100 MG capsule Take 1 capsule (100 mg total) by mouth 2 (two) times daily. 02/08/15  Yes Nonie Hoyer, PA-C  oxyCODONE (OXY IR/ROXICODONE) 5 MG immediate release tablet Take 1-2 tablets (5-10 mg total) by mouth every 6 (six) hours as needed for moderate pain or severe pain. 02/08/15  Yes Megan Meredith Leeds, PA-C  saccharomyces boulardii (FLORASTOR) 250 MG capsule Take 1 capsule (250 mg total) by mouth 2 (two) times daily. 02/08/15  Yes Nonie Hoyer, PA-C     Vital Signs: BP 107/63 mmHg  Pulse 79  Temp(Src) 97.6 F (36.4 C) (Oral)  Resp 17  Ht  (1.702 m)  Wt 185 lb 6.5 oz (84.1 kg)  BMI 29.03 kg/m2  SpO2 98%  Physical Exam  Skin: Skin is warm and dry.  Drain site clean and dry NT  No bleeding Output bloody purulent Cx NG to date Few Gr- and Gr+ rods Wbc down afeb  Nursing note and vitals reviewed.   Imaging: Ct Abdomen Pelvis W Contrast  02/15/2015  CLINICAL DATA:  Acute onset of generalized abdominal pain, nausea and vomiting. Constipation. Initial encounter. EXAM: CT ABDOMEN AND PELVIS WITH CONTRAST TECHNIQUE: Multidetector CT imaging of the abdomen and pelvis was performed using the standard protocol following bolus administration of intravenous contrast. CONTRAST:  OMNIPAQUE IOHEXOL 300 MG/ML  SOLN COMPARISON:  CT of the abdomen and pelvis from 02/02/2015 FINDINGS: Mild bibasilar atelectasis is noted. The previously noted complex abscess within the right  posterior inferior perihepatic space has resolved, with associated drainage catheter in place. Trace residual fluid and apparent residual appendicolith are noted along the right paracolic gutter. There is a new 6.5 x 5.8 x 4.4 cm peripherally enhancing abscess within the upper pelvis, with surrounding soft tissue inflammation tracking about adjacent small and large bowel loops. Additional trace peripherally enhancing fluid collections are noted tracking anteriorly, superior to the bladder. Trace fluid is noted tracking superiorly along the mesentery. There also appears to be a small 1.2 cm mesenteric abscess at the mid abdomen, at the level of the kidneys, containing fluid and trace air. The liver and spleen are unremarkable in appearance. The gallbladder is within normal limits. The pancreas and adrenal glands are unremarkable. A 2 mm nonobstructing stone is noted near the lower pole of the right kidney. Trace right-sided perinephric fluid is noted. The kidneys are otherwise unremarkable. There is no evidence of hydronephrosis. No obstructing ureteral stones are seen. The more proximal small bowel is grossly unremarkable. The stomach is within normal limits. No acute vascular abnormalities are seen. The colon is otherwise unremarkable in appearance. The bladder is mildly distended. Mild surrounding soft tissue inflammation reflects the adjacent infectious process. The prostate is normal in size. No inguinal lymphadenopathy is seen. No acute osseous abnormalities are identified. IMPRESSION: 1. New 6.5 x 5.8 x 4.4 cm peripherally enhancing abscess within the upper pelvis, with surrounding soft tissue inflammation tracking about adjacent small and large bowel loops. Additional trace peripherally enhancing fluid collections noted tracking anteriorly, superior to  the bladder. Given its central location, this may be difficult to drain. 2. Small 1.2 cm mesenteric abscess noted at the mid abdomen, at the level of the  kidneys, containing fluid and trace air. 3. Trace fluid tracks superiorly along the mesentery. 4. Previously noted complex abscess within the right posterior inferior perihepatic space has resolved, with associated drainage catheter in place. Trace residual fluid and apparent residual appendicolith noted along the right paracolic gutter. 5. 2 mm nonobstructing stone near the lower pole of the right kidney. 6. Mild soft tissue inflammation about the bladder reflects the adjacent infectious process. These results were called by telephone at the time of interpretation on 02/15/2015 at 1:49 am to Dr. Jaci Carrel, who verbally acknowledged these results. Electronically Signed   By: Roanna Raider M.D.   On: 02/15/2015 01:52   Ct Image Guided Drainage By Percutaneous Catheter  02/15/2015  CLINICAL DATA:  Status post appendectomy with additional surgical removal of retained appendicolith. Development of new postoperative abscess in the left lower pelvis. EXAM: CT GUIDED DRAINAGE OF PELVIC PERITONEAL ABSCESS ANESTHESIA/SEDATION: 3.0 Mg IV Versed 150 mcg IV Fentanyl Total Moderate Sedation Time:  35 minutes. COMPARISON:  CT of the abdomen and pelvis performed earlier today. PROCEDURE: The procedure, risks, benefits, and alternatives were explained to the patient. Questions regarding the procedure were encouraged and answered. The patient understands and consents to the procedure. The left lower abdominal wall was prepped with chlorhexidine in a sterile fashion, and a sterile drape was applied covering the operative field. A sterile gown and sterile gloves were used for the procedure. Local anesthesia was provided with 1% Lidocaine. CT of the pelvis was performed in a supine position. Prior to the procedure a Foley catheter was placed to decompress the bladder. Under CT guidance, an 18 gauge trocar needle was advanced into the left pelvic abscess from an anterior approach. A fluid sample was aspirated and sent for  culture analysis. A guidewire was advanced into the collection. A 10 French percutaneous drainage catheter was then advanced over the wire. Catheter positioning was confirmed by CT. The catheter was then flushed and connected to a suction bulb. The catheter was secured at the skin with a Prolene retention suture and StatLock device. COMPLICATIONS: None FINDINGS: The lower pelvic abscess located just to the left of midline was accessed from a left anterior approach. Aspiration yielded bloody purulent fluid. After drain placement, there is excellent return of fluid from the abscess. IMPRESSION: CT-guided percutaneous drainage of left pelvic abscess located in the peritoneal cavity. Bloody purulent fluid was aspirated with a sample sent for culture analysis. A 10 French drain was placed and attached to suction bulb drainage. Electronically Signed   By: Irish Lack M.D.   On: 02/15/2015 17:25    Labs:  CBC:  Recent Labs  02/07/15 0501 02/14/15 1948 02/15/15 0443 02/16/15 0614  WBC 10.4 15.4* 13.3* 9.7  HGB 11.6* 13.0 11.4* 11.8*  HCT 34.1* 37.4* 33.8* 34.8*  PLT 254 610* 538* 559*    COAGS:  Recent Labs  12/20/14 0225  INR 1.15  APTT 30    BMP:  Recent Labs  02/06/15 0418 02/07/15 0501 02/14/15 1948 02/15/15 0443 02/16/15 0614  NA 130* 132* 136  --  136  K 4.0 3.6 3.9  --  4.2  CL 97* 98* 99*  --  100*  CO2 --  26  GLUCOSE 125* 125* 104*  --  88  BUN <5* <5* <5*  --  <  5*  CALCIUM 8.2* 8.2* 9.3  --  9.0  CREATININE 0.51* 0.73 0.76 0.75 0.78  GFRNONAA >60 >60 >60 >60 >60  GFRAA >60 >60 >60 >60 >60    LIVER FUNCTION TESTS:  Recent Labs  12/22/14 0454 02/02/15 1040 02/03/15 0522 02/14/15 1948  BILITOT 0.3 1.1 1.0 0.5  AST 51* 23 20 45*  ALT 125* 24 23 67*  ALKPHOS 80 97 103 91  PROT 7.3 7.9 6.8 8.1  ALBUMIN 3.5 3.8 3.0* 3.4*    Assessment and Plan:  Pelvic abscess Drain placed 1/20-- intact Plan per CCS  Electronically  Signed: Breanah Faddis A 02/18/2015, 2:22 PM   I spent a total of 15 Minutes at the the patient's bedside AND on the patient's hospital floor or unit, greater than 50% of which was counseling/coordinating care for pelvic abscess drain

## 2015-02-19 ENCOUNTER — Other Ambulatory Visit: Payer: Self-pay

## 2015-02-19 MED ORDER — ENSURE ENLIVE PO LIQD
237.0000 mL | ORAL | Status: DC
  Administered 2015-02-19 – 2015-02-22 (×4): 237 mL via ORAL

## 2015-02-19 NOTE — Progress Notes (Signed)
Patient ID: Dale Crane, male   DOB: 11-03-1985, 31 y.o.   MRN: [id number]   CT not ordered. Will hold for now

## 2015-02-19 NOTE — Progress Notes (Signed)
  Subjective: Comfortable. Minimal pain at drain insertion site. Tolerating current diet (soft diet) and is ambulatory. Afebrile over night. No complaints of N/V. Speaks limited English.  Objective: Vital signs in last 24 hours: Temp:  [98.1 F (36.7 C)-98.2 F (36.8 C)] 98.1 F (36.7 C) (01/24 0502) Pulse Rate:  [79-93] 81 (01/24 0502) Resp:  [16-18] 16 (01/24 0502) BP: (99-101)/(61-84) 99/61 mmHg (01/24 0502) SpO2:  [98 %-99 %] 99 % (01/24 0502) Last BM Date: 02/17/15  Intake/Output from previous day: 01/23 0701 - 01/24 0700 In: 1320 [P.O.:1320] Out: 5 [Drains:5] Intake/Output this shift:    PE: GEN: resting comfortably. NAD. Afebrile overnight. CV: RRR Pulm: CTA B/L Abd: soft, NTND. Single JP drain in place at LLQ draining yellow serous fluid. 22cc over past 24 hours. Drain site clean  Lab Results:  No results for input(s): WBC, HGB, HCT, PLT in the last 72 hours. BMET No results for input(s): NA, K, CL, CO2, GLUCOSE, BUN, CREATININE, CALCIUM in the last 72 hours. PT/INR No results for input(s): LABPROT, INR in the last 72 hours. CMP     Component Value Date/Time   NA 136 02/16/2015 0614   K 4.2 02/16/2015 0614   CL 100* 02/16/2015 0614   CO2 26 02/16/2015 0614   GLUCOSE 88 02/16/2015 0614   BUN <5* 02/16/2015 0614   CREATININE 0.78 02/16/2015 0614   CALCIUM 9.0 02/16/2015 0614   PROT 8.1 02/14/2015 1948   ALBUMIN 3.4* 02/14/2015 1948   AST 45* 02/14/2015 1948   ALT 67* 02/14/2015 1948   ALKPHOS 91 02/14/2015 1948   BILITOT 0.5 02/14/2015 1948   GFRNONAA >60 02/16/2015 0614   GFRAA >60 02/16/2015 0614   Lipase     Component Value Date/Time   LIPASE 29 02/14/2015 1948       Studies/Results: No results found.  Anti-infectives: Anti-infectives    Start     Dose/Rate Route Frequency Ordered Stop   02/15/15 0500  ertapenem (INVANZ) 1 g in sodium chloride 0.9 % 50 mL IVPB     1 g 100 mL/hr over 30 Minutes Intravenous Daily 02/15/15 0404          Assessment/Plan 30 y/o male, HD #4, with hx of perforated appendicitis (remote appendectomy) with recurrent intraabdominal abscesses. S/p IR intraabdominal drain placement 02/15/2015.  On day #5 IV abx (Invanz) with JP drain in place at LLQ. CT A/P scheduled for today. Remains afebrile. Plan for drain to follow CT results. Diet: soft DVT prophylaxis: Lovenox and SCDs.   LOS: 4 days    LEE Lindzy Rupert, [facility] 02/19/2015, 7:46 AM

## 2015-02-19 NOTE — Progress Notes (Signed)
Nutrition Follow-up  DOCUMENTATION CODES:   Not applicable  INTERVENTION:   -D/c Boost Breeze due to poor acceptance -Ensure Enlive po daily, each supplement provides 350 kcal and 20 grams of protein -Continue 30 ml Prostat BID, each supplement provides 100 kcals and 15 grams protein  NUTRITION DIAGNOSIS:   Inadequate oral intake related to poor appetite as evidenced by  (variable PO intake).  Ongoing  GOAL:   Patient will meet greater than or equal to 90% of their needs  Progressing  MONITOR:   PO intake, Supplement acceptance, Diet advancement, Labs, Weight trends, Skin, I & O's  REASON FOR ASSESSMENT:   Malnutrition Screening Tool    ASSESSMENT:   Pt admitted in early January c/o abdominal pain. Per MD-he had appendectomy and November 2016 for perforated appendix. He had postoperative abscess along the right colon and right infrahepatic space treated with a percutaneous drain the last 7 weeks. He been developed more pain along his right abdomen without fever or chills. CT scan was obtained which showed recurrence of the abscess with a fecalith it's been present since November 2016.  Pt sleeping at time of visit and snoring audibly. Pt did not awake when name being called.   Surgery pulled rt JP drain on 02/17/15. He remains with lt JP drain and on IV antibiotics.   Pt has been advanced to a soft diet, with variable intake. PO: 0-100%, averaging 50% of meal completion. He is accepting Prostat supplements, however, refusing Boost Breeze.   Per chart review, awaiting CT scan of abdomen and pelvis today.   Labs reviewed.   Diet Order:  DIET SOFT Room service appropriate?: Yes; Fluid consistency:: Thin  Skin:  Wound (see comment) (closed mid pelvis incision)  Last BM:  02/17/15  Height:   Ht Readings from Last 1 Encounters:  02/15/15  (1.702 m)    Weight:   Wt Readings from Last 1 Encounters:  02/15/15 185 lb 6.5 oz (84.1 kg)    Ideal Body Weight:   67.2 kg  BMI:  Body mass index is 29.03 kg/(m^2).  Estimated Nutritional Needs:   Kcal:  1900-2100  Protein:  95-110 grams  Fluid:  1.9-2.1 L  EDUCATION NEEDS:   No education needs identified at this time  Jovany Disano A. Mayford Knife, RD, LDN, CDE Pager: [phone] After hours Pager: [phone]

## 2015-02-20 LAB — ANAEROBIC CULTURE: Special Requests: NORMAL

## 2015-02-20 MED ORDER — SODIUM CHLORIDE 0.9 % IV SOLN
3.0000 g | Freq: Four times a day (QID) | INTRAVENOUS | Status: DC
  Administered 2015-02-20 – 2015-02-23 (×11): 3 g via INTRAVENOUS
  Filled 2015-02-20 (×14): qty 3

## 2015-02-20 MED ORDER — SACCHAROMYCES BOULARDII 250 MG PO CAPS
250.0000 mg | ORAL_CAPSULE | Freq: Two times a day (BID) | ORAL | Status: DC
  Administered 2015-02-20 – 2015-02-23 (×7): 250 mg via ORAL
  Filled 2015-02-20 (×7): qty 1

## 2015-02-20 MED ORDER — FAMOTIDINE 20 MG PO TABS
20.0000 mg | ORAL_TABLET | Freq: Two times a day (BID) | ORAL | Status: DC
  Administered 2015-02-20 – 2015-02-23 (×7): 20 mg via ORAL
  Filled 2015-02-20 (×7): qty 1

## 2015-02-20 MED ORDER — SODIUM CHLORIDE 0.9 % IV SOLN
3.0000 g | Freq: Four times a day (QID) | INTRAVENOUS | Status: DC
  Administered 2015-02-20: 3 g via INTRAVENOUS
  Filled 2015-02-20 (×5): qty 3

## 2015-02-20 NOTE — Progress Notes (Signed)
Patient ID: Dale Crane, male   DOB: 07-29-1985, 30 y.o.   MRN: [id number]    Referring Physician(s): CCS  Chief Complaint: Intra-abdominal abscess  Subjective: Patient was sitting on the toilet this morning and his drain got tugged and has been bleeding some.  He states he has some pain around the site now as well  Allergies: Review of patient's allergies indicates no known allergies.  Medications: Prior to Admission medications   Medication Sig Start Date End Date Taking? Authorizing Provider  amoxicillin-clavulanate (AUGMENTIN) 875-125 MG tablet Take 1 tablet by mouth 2 (two) times daily. 02/08/15  Yes Nonie Hoyer, PA-C  docusate sodium (COLACE) 100 MG capsule Take 1 capsule (100 mg total) by mouth 2 (two) times daily. 02/08/15  Yes Nonie Hoyer, PA-C  oxyCODONE (OXY IR/ROXICODONE) 5 MG immediate release tablet Take 1-2 tablets (5-10 mg total) by mouth every 6 (six) hours as needed for moderate pain or severe pain. 02/08/15  Yes Megan Meredith Leeds, PA-C  saccharomyces boulardii (FLORASTOR) 250 MG capsule Take 1 capsule (250 mg total) by mouth 2 (two) times daily. 02/08/15  Yes Nonie Hoyer, PA-C    Vital Signs: BP 107/64 mmHg  Pulse 89  Temp(Src) 98.1 F (36.7 C) (Oral)  Resp 18  Ht  (1.702 m)  Wt 185 lb 6.5 oz (84.1 kg)  BMI 29.03 kg/m2  SpO2 99%  Physical Exam: Abd: soft, mildly tender around the drain, some evidence of prior oozing from around the drain site, but appears to have slowed and/or stopped.  There is mostly serous drainage in his bulb, but there is some evidence of fresh blood present likely that is run down  Imaging: No results found.  Labs:  CBC:  Recent Labs  02/07/15 0501 02/14/15 1948 02/15/15 0443 02/16/15 0614  WBC 10.4 15.4* 13.3* 9.7  HGB 11.6* 13.0 11.4* 11.8*  HCT 34.1* 37.4* 33.8* 34.8*  PLT 254 610* 538* 559*    COAGS:  Recent Labs  12/20/14 0225  INR 1.15  APTT 30    BMP:  Recent Labs  02/06/15 0418  02/07/15 0501 02/14/15 1948 02/15/15 0443 02/16/15 0614  NA 130* 132* 136  --  136  K 4.0 3.6 3.9  --  4.2  CL 97* 98* 99*  --  100*  CO2 --  26  GLUCOSE 125* 125* 104*  --  88  BUN <5* <5* <5*  --  <5*  CALCIUM 8.2* 8.2* 9.3  --  9.0  CREATININE 0.51* 0.73 0.76 0.75 0.78  GFRNONAA >60 >60 >60 >60 >60  GFRAA >60 >60 >60 >60 >60    LIVER FUNCTION TESTS:  Recent Labs  12/22/14 0454 02/02/15 1040 02/03/15 0522 02/14/15 1948  BILITOT 0.3 1.1 1.0 0.5  AST 51* 23 20 45*  ALT 125* 24 23 67*  ALKPHOS 80 97 103 91  PROT 7.3 7.9 6.8 8.1  ALBUMIN 3.5 3.8 3.0* 3.4*    Assessment and Plan: Intra-abdominal abscess, s/p perc drain 1/20 by Dr. Fredia Sorrow -cont drain for now per CCS, no repeat CT scan until Friday 1/27 -bleeding appears to have stopped and is likely irritation from being tugged on.  Will d/w Dr. Grace Isaac to assure no further actions are warranted  Electronically Signed: Medard Decuir E 02/20/2015, 11:14 AM   I spent a total of 15 Minutes at the the patient's bedside AND on the patient's hospital floor or unit, greater than 50% of which was counseling/coordinating care for intra-abdominal  abscess

## 2015-02-20 NOTE — Progress Notes (Signed)
  Subjective: He got up to BR and somehow got some skin bleeding around the drain insertion site, It appears to be fine, I don't see any marked change in the drain position.  He still has some discomfort upper left abdomen, but not  In the lower quad where the drain is.  Nothing recorded in the drain output, but he has some cloudy serous fluid present in the bulb.    Objective: Vital signs in last 24 hours: Temp:  [97.5 F (36.4 C)-98.3 F (36.8 C)] 98.1 F (36.7 C) (01/25 0514) Pulse Rate:  [76-92] 76 (01/25 0514) Resp:  [18] 18 (01/25 0514) BP: (87-115)/(58-80) 87/58 mmHg (01/25 0514) SpO2:  [98 %] 98 % (01/25 0514) Last BM Date: 02/17/15 1040 PO  Soft diet Nothing recorded from drain yesterday BP down early AM, afebrile. Labs [place] . Last draw 02/16/15 Intake/Output from previous day: 01/24 0701 - 01/25 0700 In: 1040 [P.O.:1040] Out: 0  Intake/Output this shift:    General appearance: alert, cooperative, no distress and he was afraid when he saw the blood and worried he was bleeding inside.  No pain or discoloration in this area.   GI: soft, has some low grade discomfort left upper abdomen, he is not taking anything for pain.  sore at the drain site, it appears OK, just some skin bleeding at the site.  Lab Results:  No results for input(s): WBC, HGB, HCT, PLT in the last 72 hours.  BMET No results for input(s): NA, K, CL, CO2, GLUCOSE, BUN, CREATININE, CALCIUM in the last 72 hours. PT/INR No results for input(s): LABPROT, INR in the last 72 hours.   Recent Labs Lab 02/14/15 1948  AST 45*  ALT 67*  ALKPHOS 91  BILITOT 0.5  PROT 8.1  ALBUMIN 3.4*     Lipase     Component Value Date/Time   LIPASE 29 02/14/2015 1948     Studies/Results: No results found.  Medications: . enoxaparin (LOVENOX) injection  40 mg Subcutaneous Daily  . ertapenem  1 g Intravenous Daily  . feeding supplement (ENSURE ENLIVE)  237 mL Oral Q24H  . feeding supplement (PRO-STAT SUGAR FREE  64)  30 mL Oral BID  . pantoprazole (PROTONIX) IV  40 mg Intravenous QHS       Assessment/Plan Hx of perforated appendicitis, with prior 2 prior surgeries in [place] for perforated appendix. Appendectomy with lysis of adhesion 12/13/14 Dr. Violeta Gelinas Laparoscopy and washout 02/04/15 Dr. Derrell Lolling New abscess with drain placement by IR 02/15/15 Multiple CT guided aspirations for abscess since 11/2014 Antibiotics: Day 6 Invanz DVT: Lovenox/SCD    Plan: He has had 10 CT scans since November so we are going to wait until 1/27 for the next CT, continue IV Invanz during this time.   I will check labs in AM.  I am stopping IV  Protonix, I'm not sure he needs anything, but will put him on oral Pepcid, add probiotic.  LOS: 5 days    Dale Crane 02/20/2015

## 2015-02-20 NOTE — Progress Notes (Signed)
Pt drain in pelvic area was pulled during the morning and pt draining from site.  Zola Button, PA, aware.  Dressing changed twice, once by PA, and once by nurse.  PA made aware of the second dsg change and of the drain continuing to drain from the site.  PA stated to call IR.  Notified IR and they stated they were dealing with a case and to call the PA on call.  Paged PA in IR, but did not return phone call.  Will continue to assess site.

## 2015-02-20 NOTE — Progress Notes (Signed)
Site of JP drain of patient noted bleeding.  According to patient, he was in the bathroom trying to have a BM and was straining when he noticed bleeding from the right JPdrain site.  Cleansed the JP drain site and changed the dressing.

## 2015-02-21 LAB — CBC
HCT: 37.4 % — ABNORMAL LOW (ref 39.0–52.0)
Hemoglobin: 12.3 g/dL — ABNORMAL LOW (ref 13.0–17.0)
MCH: 26.1 pg (ref 26.0–34.0)
MCHC: 32.9 g/dL (ref 30.0–36.0)
MCV: 79.2 fL (ref 78.0–100.0)
PLATELETS: 465 10*3/uL — AB (ref 150–400)
RBC: 4.72 MIL/uL (ref 4.22–5.81)
RDW: 13.1 % (ref 11.5–15.5)
WBC: 8.5 10*3/uL (ref 4.0–10.5)

## 2015-02-21 LAB — COMPREHENSIVE METABOLIC PANEL
ALBUMIN: 3.1 g/dL — AB (ref 3.5–5.0)
ALT: 49 U/L (ref 17–63)
AST: 28 U/L (ref 15–41)
Alkaline Phosphatase: 67 U/L (ref 38–126)
Anion gap: 8 (ref 5–15)
BUN: 10 mg/dL (ref 6–20)
CHLORIDE: 102 mmol/L (ref 101–111)
CO2: 29 mmol/L (ref 22–32)
Calcium: 9 mg/dL (ref 8.9–10.3)
Creatinine, Ser: 0.77 mg/dL (ref 0.61–1.24)
GFR calc Af Amer: >60 mL/min (ref >60)
Glucose, Bld: 105 mg/dL — ABNORMAL HIGH (ref 65–99)
POTASSIUM: 4.1 mmol/L (ref 3.5–5.1)
Sodium: 139 mmol/L (ref 135–145)
Total Bilirubin: 0.3 mg/dL (ref 0.3–1.2)
Total Protein: 7.2 g/dL (ref 6.5–8.1)

## 2015-02-21 NOTE — Progress Notes (Signed)
  Subjective: Still having some pain LUQ, and new discomfort RUQ, not much but something new is there.  No further issues with bleeding.  Dressing is dry and intact, I did not take it down.  Objective: Vital signs in last 24 hours: Temp:  [97.6 F (36.4 C)-98.9 F (37.2 C)] 97.7 F (36.5 C) (01/26 0602) Pulse Rate:  [78-101] 78 (01/26 0602) Resp:  [18] 18 (01/26 0602) BP: (102-112)/(58-65) 104/58 mmHg (01/26 0602) SpO2:  [98 %-99 %] 98 % (01/26 0602) Last BM Date: 02/17/15 Afebrile, VSS Regular diet Labs all look good, normal WBC Intake/Output from previous day: 01/25 0701 - 01/26 0700 In: 1450 [P.O.:1140; IV Piggyback:300] Out: 421 [Urine:401; Drains:20] Intake/Output this shift:    General appearance: alert, cooperative and no distress GI: soft, new site of tendernes RUQ, no change in drain site or mild discomfort LUQ  Lab Results:   Recent Labs  02/21/15 0342  WBC 8.5  HGB 12.3*  HCT 37.4*  PLT 465*    BMET  Recent Labs  02/21/15 0342  NA 139  K 4.1  CL 102  CO2 29  GLUCOSE 105*  BUN 10  CREATININE 0.77  CALCIUM 9.0   PT/INR No results for input(s): LABPROT, INR in the last 72 hours.   Recent Labs Lab 02/14/15 1948 02/21/15 0342  AST 45* 28  ALT 67* 49  ALKPHOS 91 67  BILITOT 0.5 0.3  PROT 8.1 7.2  ALBUMIN 3.4* 3.1*     Lipase     Component Value Date/Time   LIPASE 29 02/14/2015 1948     Studies/Results: No results found.  Medications: . ampicillin-sulbactam (UNASYN) IVPB 3 g  3 g Intravenous Q6H  . enoxaparin (LOVENOX) injection  40 mg Subcutaneous Daily  . famotidine  20 mg Oral BID  . feeding supplement (ENSURE ENLIVE)  237 mL Oral Q24H  . feeding supplement (PRO-STAT SUGAR FREE 64)  30 mL Oral BID  . saccharomyces boulardii  250 mg Oral BID   Specimen Description ABSCESS PELVIS   Special Requests Normal   Gram Stain ABUNDANT WBC PRESENT,BOTH PMN AND MONONUCLEAR  NO SQUAMOUS EPITHELIAL CELLS SEEN  ABUNDANT GRAM  POSITIVE COCCI  IN PAIRS IN CLUSTERS ABUNDANT GRAM POSITIVE RODS  ABUNDANT GRAM NEGATIVE RODS       Culture Anaerobic BACTEROIDES FRAGILIS  Note: BETA LACTAMASE POSITIVE  Performed at [facility]       Report Status 02/20/2015 FINAL          Assessment/Plan Hx of perforated appendicitis, with prior 2 prior surgeries in [place] for perforated appendix. Appendectomy with lysis of adhesion 12/13/14 Dr. Violeta Gelinas Laparoscopy and washout 02/04/15 Dr. Derrell Lolling New abscess with drain placement by IR 02/15/15 Multiple CT guided aspirations for abscess since 11/2014 Antibiotics: Day 6 Invanz 02/20/15; converted to Ampicillin-Sulbactam 02/20/15 PM by Pharmacy DVT: Lovenox/SCD   Plan:  CT tomorrow, he was changed to Unasyn last PM by Pharmacy.  Apparently discussed with Dr. Birdie Hopes. Plan CT tomorrow.  LOS: 6 days    Meekah Math 02/21/2015

## 2015-02-21 NOTE — Progress Notes (Signed)
Patient ID: Dale Crane, male   DOB: [birth year], 30 y.o.   MRN: [id number]    Referring Physician(s): CCS  Chief Complaint:  Pelvic abscess  Subjective: Pt doing ok; has some discomfort at LLQ drain site; drain was pulled on yesterday during visit to bathroom but statlock device/cath appear secure today   Allergies: Review of patient's allergies indicates no known allergies.  Medications: Prior to Admission medications   Medication Sig Start Date End Date Taking? Authorizing Provider  amoxicillin-clavulanate (AUGMENTIN) 875-125 MG tablet Take 1 tablet by mouth 2 (two) times daily. 02/08/15  Yes Nonie Hoyer, PA-C  docusate sodium (COLACE) 100 MG capsule Take 1 capsule (100 mg total) by mouth 2 (two) times daily. 02/08/15  Yes Nonie Hoyer, PA-C  oxyCODONE (OXY IR/ROXICODONE) 5 MG immediate release tablet Take 1-2 tablets (5-10 mg total) by mouth every 6 (six) hours as needed for moderate pain or severe pain. 02/08/15  Yes Megan Meredith Leeds, PA-C  saccharomyces boulardii (FLORASTOR) 250 MG capsule Take 1 capsule (250 mg total) by mouth 2 (two) times daily. 02/08/15  Yes Nonie Hoyer, PA-C     Vital Signs: BP 107/68 mmHg  Pulse 76  Temp(Src) 97.7 F (36.5 C) (Oral)  Resp 18  Ht  (1.702 m)  Wt 185 lb 6.5 oz (84.1 kg)  BMI 29.03 kg/m2  SpO2 98%  Physical Exam LLQ drain intact, output about 10 cc's blood-tinged fluid; cx's- bacteroides fragilis/strept; drain irrigated with 5 cc sterile NS with return of small clot fragment  Imaging: No results found.  Labs:  CBC:  Recent Labs  02/14/15 1948 02/15/15 0443 02/16/15 0614 02/21/15 0342  WBC 15.4* 13.3* 9.7 8.5  HGB 13.0 11.4* 11.8* 12.3*  HCT 37.4* 33.8* 34.8* 37.4*  PLT 610* 538* 559* 465*    COAGS:  Recent Labs  12/20/14 0225  INR 1.15  APTT 30    BMP:  Recent Labs  02/07/15 0501 02/14/15 1948 02/15/15 0443 02/16/15 0614 02/21/15 0342  NA 132* 136  --  136 139  K 3.6 3.9  --  4.2 4.1    CL 98* 99*  --  100* 102  CO2 28 27  --  26 29  GLUCOSE 125* 104*  --  88 105*  BUN <5* <5*  --  <5* 10  CALCIUM 8.2* 9.3  --  9.0 9.0  CREATININE 0.73 0.76 0.75 0.78 0.77  GFRNONAA >60 >60 >60 >60 >60  GFRAA >60 >60 >60 >60 >60    LIVER FUNCTION TESTS:  Recent Labs  02/02/15 1040 02/03/15 0522 02/14/15 1948 02/21/15 0342  BILITOT 1.1 1.0 0.5 0.3  AST 23 20 45* 28  ALT 24 23 67* 49  ALKPHOS 97 103 91 67  PROT 7.9 6.8 8.1 7.2  ALBUMIN 3.8 3.0* 3.4* 3.1*    Assessment and Plan: S/p drainage of left pelvic abscess 1/20; AF; WBC 8.5; hgb 12.3;creat 0.77; output decreasing; for f/u CT 1/27                                                              Electronically Signed: D. Jeananne Rama 02/21/2015, 3:13 PM   I spent a total of 15 minutes at the the patient's bedside AND on the patient's hospital floor or unit, greater  than 50% of which was counseling/coordinating care for left pelvic abscess drain

## 2015-02-22 ENCOUNTER — Encounter (HOSPITAL_COMMUNITY): Payer: Self-pay | Admitting: *Deleted

## 2015-02-22 ENCOUNTER — Inpatient Hospital Stay (HOSPITAL_COMMUNITY): Payer: MEDICAID

## 2015-02-22 ENCOUNTER — Inpatient Hospital Stay (HOSPITAL_COMMUNITY): Payer: Self-pay

## 2015-02-22 LAB — CREATININE, SERUM
Creatinine, Ser: 0.85 mg/dL (ref 0.61–1.24)
GFR calc Af Amer: >60 mL/min (ref >60)
GFR calc non Af Amer: >60 mL/min (ref >60)

## 2015-02-22 MED ORDER — IOHEXOL 300 MG/ML  SOLN
50.0000 mL | Freq: Once | INTRAMUSCULAR | Status: AC | PRN
  Administered 2015-02-22: 3 mL via INTRAVENOUS

## 2015-02-22 MED ORDER — IOHEXOL 300 MG/ML  SOLN
100.0000 mL | Freq: Once | INTRAMUSCULAR | Status: AC | PRN
  Administered 2015-02-22: 100 mL via INTRAVENOUS

## 2015-02-22 NOTE — Progress Notes (Signed)
Referring Physician(s): CCS  Chief Complaint:  Pelvic abscess Drain placed 1/20  Subjective:  Abscess output minimal x few days Wbc wnl afeb CT 1/26 shows resolution of collection, but Dr Bonnielee Haff did recommend drain injection before removal Now scheduled for same Pt does state only pain with drain is when flushing  Allergies: Review of patient's allergies indicates no known allergies.  Medications: Prior to Admission medications   Medication Sig Start Date End Date Taking? Authorizing Provider  amoxicillin-clavulanate (AUGMENTIN) 875-125 MG tablet Take 1 tablet by mouth 2 (two) times daily. 02/08/15  Yes Nonie Hoyer, PA-C  docusate sodium (COLACE) 100 MG capsule Take 1 capsule (100 mg total) by mouth 2 (two) times daily. 02/08/15  Yes Nonie Hoyer, PA-C  oxyCODONE (OXY IR/ROXICODONE) 5 MG immediate release tablet Take 1-2 tablets (5-10 mg total) by mouth every 6 (six) hours as needed for moderate pain or severe pain. 02/08/15  Yes Megan Meredith Leeds, PA-C  saccharomyces boulardii (FLORASTOR) 250 MG capsule Take 1 capsule (250 mg total) by mouth 2 (two) times daily. 02/08/15  Yes Nonie Hoyer, PA-C     Vital Signs: BP 104/72 mmHg  Pulse 73  Temp(Src) 98.6 F (37 C) (Oral)  Resp 18  Ht  (1.702 m)  Wt 185 lb 6.5 oz (84.1 kg)  BMI 29.03 kg/m2  SpO2 100%  Physical Exam  Abdominal: Soft. Bowel sounds are normal. There is no tenderness.  Neurological: He is alert.  Skin: Skin is warm and dry.  Site of Left pelvic abscess drain is clean and dry NT Output blood tinged serous fluid 5 cc in JP 10 cc total yesterday Wbc wnl afeb Cx microaerophilic strept    Imaging: Ct Abdomen Pelvis W Contrast  02/22/2015  CLINICAL DATA:  Abdominal abscess EXAM: CT ABDOMEN AND PELVIS WITH CONTRAST TECHNIQUE: Multidetector CT imaging of the abdomen and pelvis was performed using the standard protocol following bolus administration of intravenous contrast. CONTRAST:  OMNIPAQUE  IOHEXOL 300 MG/ML  SOLN COMPARISON:  02/15/2015 FINDINGS: Liver, spleen, gallbladder, pancreas, and adrenal glands are within normal limits. Kidneys are within normal limits. Surgical drain in the right pericolic gutter has been removed. A calcific density remains in the lower right pericolic gutter. Inflammatory changes persist about this calcification. This may represent an appendicolith. 10 mm soft tissue density in the anterior mid mesenteric on image 50 is not significantly changed. There is a small extraluminal gas bubble within this area. Previously, this was characterized as a tiny abscess. A surgical drain has been placed into the left pelvic abscess. The abscess cavity is completely decompressed. The drain remains in place. Inflammatory changes of the bladder have improved. Inflammatory changes in the adjacent fat and adjacent sigmoid colon have improved. There is some thickening of the left rectus muscle likely due to inflammation. No evidence of new abscess. IMPRESSION: Status post drainage of the large left pelvic abscess. The abscess has resolved. Adjacent inflammatory changes have improved. Consider abscess strain injection to evaluate for fistula. Tiny mesenteric abscess is not significantly changed. It is primarily now characterized by inflammatory tissue rather than fluid. Right pericolic gutter surgical drain has been removed. Minimal inflammatory changes persist. A calcification persists worrisome for appendicolith. Electronically Signed   By: Jolaine Click M.D.   On: 02/22/2015 07:49    Labs:  CBC:  Recent Labs  02/14/15 1948 02/15/15 0443 02/16/15 0614 02/21/15 0342  WBC 15.4* 13.3* 9.7 8.5  HGB 13.0 11.4* 11.8* 12.3*  HCT 37.4* 33.8* 34.8* 37.4*  PLT 610* 538* 559* 465*    COAGS:  Recent Labs  12/20/14 0225  INR 1.15  APTT 30    BMP:  Recent Labs  02/07/15 0501 02/14/15 1948 02/15/15 0443 02/16/15 0614 02/21/15 0342 02/22/15 0436  NA 132* 136  --  136 139   --   K 3.6 3.9  --  4.2 4.1  --   CL 98* 99*  --  100* 102  --   CO2 28 27  --  26 29  --   GLUCOSE 125* 104*  --  88 105*  --   BUN <5* <5*  --  <5* 10  --   CALCIUM 8.2* 9.3  --  9.0 9.0  --   CREATININE 0.73 0.76 0.75 0.78 0.77 0.85  GFRNONAA >60 >60 >60 >60 >60 >60  GFRAA >60 >60 >60 >60 >60 >60    LIVER FUNCTION TESTS:  Recent Labs  02/02/15 1040 02/03/15 0522 02/14/15 1948 02/21/15 0342  BILITOT 1.1 1.0 0.5 0.3  AST 23 20 45* 28  ALT 24 23 67* 49  ALKPHOS 97 103 91 67  PROT 7.9 6.8 8.1 7.2  ALBUMIN 3.8 3.0* 3.4* 3.1*    Assessment and Plan:  Pelvic abscess drain placed 1/20 CT 1/26 shows resolution For drain  Injection today Possible pull Pt consented to procedure with Spanish Interpreter at bedside  Electronically Signed: Markell Schrier A 02/22/2015, 9:39 AM   I spent a total of 15 Minutes at the the patient's bedside AND on the patient's hospital floor or unit, greater than 50% of which was counseling/coordinating care for pelvic abscess drain

## 2015-02-22 NOTE — Progress Notes (Signed)
Patient ID: Dale Crane, male   DOB: 1985/05/28, 30 y.o.   MRN: [id number]     [facility]      [address] [place], [place] [address]    Phone: [phone] FAX: [phone]     Subjective: Talked via pacific interpreter   Afebrile. 38m drain output.    Objective:  Vital signs:  Filed Vitals:   02/21/15 1829 02/21/15 2107 02/22/15 0210 02/22/15 0632  BP: 104/90 100/65 100/62 104/72  Pulse: 97 90 64 73  Temp: 98.3 F (36.8 C) 98.6 F (37 C) 97.5 F (36.4 C) 98.6 F (37 C)  TempSrc: Oral Oral Oral Oral  Resp: 18 18 18 18   Height:      Weight:      SpO2: 99% 98% 100% 100%    Last BM Date: 02/17/15  Intake/Output   Yesterday:  01/26 0701 - 01/27 0700 In: 905 [P.O.:800; IV Piggyback:100] Out: 12979 [[id number] Drains:10] This shift:     Physical Exam: General: Pt awake/alert/oriented x4 in no acute distress Abdomen: Soft.  Nondistended.  Mild ttp llq.  Drain with serosanguinous output.  No evidence of peritonitis.  No incarcerated hernias.    Problem List:   Active Problems:   Intra-abdominal abscess ([facility]    Results:   Labs: Results for orders placed or performed during the hospital encounter of 02/15/15 (from the past 48 hour(s))  CBC     Status: Abnormal   Collection Time: 02/21/15  3:42 AM  Result Value Ref Range   WBC 8.5 4.0 - 10.5 K/uL   RBC 4.72 4.22 - 5.81 MIL/uL   Hemoglobin 12.3 (L) 13.0 - 17.0 g/dL   HCT 37.4 (L) 39.0 - 52.0 %   MCV 79.2 78.0 - 100.0 fL   MCH 26.1 26.0 - 34.0 pg   MCHC 32.9 30.0 - 36.0 g/dL   RDW 13.1 11.5 - 15.5 %   Platelets 465 (H) 150 - 400 K/uL  Comprehensive metabolic panel     Status: Abnormal   Collection Time: 02/21/15  3:42 AM  Result Value Ref Range   Sodium 139 135 - 145 mmol/L   Potassium 4.1 3.5 - 5.1 mmol/L   Chloride 102 101 - 111 mmol/L   CO2 29 22 - 32 mmol/L   Glucose, Bld 105 (H) 65 - 99 mg/dL   BUN 10 6 - 20 mg/dL   Creatinine,  Ser 0.77 0.61 - 1.24 mg/dL   Calcium 9.0 8.9 - 10.3 mg/dL   Total Protein 7.2 6.5 - 8.1 g/dL   Albumin 3.1 (L) 3.5 - 5.0 g/dL   AST 28 15 - 41 U/L   ALT 49 17 - 63 U/L   Alkaline Phosphatase 67 38 - 126 U/L   Total Bilirubin 0.3 0.3 - 1.2 mg/dL   GFR calc non Af Amer >60 >60 mL/min   GFR calc Af Amer >60 >60 mL/min    Comment: (NOTE) The eGFR has been calculated using the CKD EPI equation. This calculation has not been validated in all clinical situations. eGFR's persistently <60 mL/min signify possible Chronic Kidney Disease.    Anion gap 8 5 - 15  Creatinine, serum     Status: None   Collection Time: 02/22/15  4:36 AM  Result Value Ref Range   Creatinine, Ser 0.85 0.61 - 1.24 mg/dL   GFR calc non Af Amer >60 >60 mL/min   GFR calc Af Amer >60 >60 mL/min    Comment: (  NOTE) The eGFR has been calculated using the CKD EPI equation. This calculation has not been validated in all clinical situations. eGFR's persistently <60 mL/min signify possible Chronic Kidney Disease.     Imaging / Studies: Ct Abdomen Pelvis W Contrast  02/22/2015  CLINICAL DATA:  Abdominal abscess EXAM: CT ABDOMEN AND PELVIS WITH CONTRAST TECHNIQUE: Multidetector CT imaging of the abdomen and pelvis was performed using the standard protocol following bolus administration of intravenous contrast. CONTRAST:  140m OMNIPAQUE IOHEXOL 300 MG/ML  SOLN COMPARISON:  02/15/2015 FINDINGS: Liver, spleen, gallbladder, pancreas, and adrenal glands are within normal limits. Kidneys are within normal limits. Surgical drain in the right pericolic gutter has been removed. A calcific density remains in the lower right pericolic gutter. Inflammatory changes persist about this calcification. This may represent an appendicolith. 10 mm soft tissue density in the anterior mid mesenteric on image 50 is not significantly changed. There is a small extraluminal gas bubble within this area. Previously, this was characterized as a tiny abscess. A  surgical drain has been placed into the left pelvic abscess. The abscess cavity is completely decompressed. The drain remains in place. Inflammatory changes of the bladder have improved. Inflammatory changes in the adjacent fat and adjacent sigmoid colon have improved. There is some thickening of the left rectus muscle likely due to inflammation. No evidence of new abscess. IMPRESSION: Status post drainage of the large left pelvic abscess. The abscess has resolved. Adjacent inflammatory changes have improved. Consider abscess strain injection to evaluate for fistula. Tiny mesenteric abscess is not significantly changed. It is primarily now characterized by inflammatory tissue rather than fluid. Right pericolic gutter surgical drain has been removed. Minimal inflammatory changes persist. A calcification persists worrisome for appendicolith. Electronically Signed   By: AMarybelle KillingsM.D.   On: 02/22/2015 07:49    Medications / Allergies:  Scheduled Meds: . ampicillin-sulbactam (UNASYN) IVPB 3 g  3 g Intravenous Q6H  . enoxaparin (LOVENOX) injection  40 mg Subcutaneous Daily  . famotidine  20 mg Oral BID  . feeding supplement (ENSURE ENLIVE)  237 mL Oral Q24H  . feeding supplement (PRO-STAT SUGAR FREE 64)  30 mL Oral BID  . saccharomyces boulardii  250 mg Oral BID   Continuous Infusions:  PRN Meds:.acetaminophen **OR** acetaminophen, diphenhydrAMINE **OR** diphenhydrAMINE, methocarbamol, morphine injection, ondansetron **OR** ondansetron (ZOFRAN) IV, oxyCODONE  Antibiotics: Anti-infectives    Start     Dose/Rate Route Frequency Ordered Stop   02/20/15 2200  Ampicillin-Sulbactam (UNASYN) 3 g in sodium chloride 0.9 % 100 mL IVPB     3 g 100 mL/hr over 60 Minutes Intravenous Every 6 hours 02/20/15 1608     02/20/15 1200  Ampicillin-Sulbactam (UNASYN) 3 g in sodium chloride 0.9 % 100 mL IVPB  Status:  Discontinued     3 g 100 mL/hr over 60 Minutes Intravenous Every 6 hours 02/20/15 1142 02/20/15 1608    02/15/15 0500  ertapenem (INVANZ) 1 g in sodium chloride 0.9 % 50 mL IVPB  Status:  Discontinued     1 g 100 mL/hr over 30 Minutes Intravenous Daily 02/15/15 0404 02/20/15 1141        Assessment/Plan [place]X [place]14 by Dr. [place] Silos laparoscopy, fecalith, washout Dr. RRosendo Gros1/9/17. Pelvic abscess s/p IR drain 1/20 -CT shows resolution of abscess, radiology recommends drain injection to evaluate for fistula ID-unasyn.  Cx reveals microaerophilic strep and bacteroides fragilis, beta lactamase positive.  Will ask pharmacy for help to ensure  unasyn is sensitive.  VTE prophylaxis-SCD/lovenox  FEN-no issues  Dispo-pending drain injection   Erby Pian, ANP-BC [facility] Pager [phone](7A-4:30P) For consults and floor pages call [phone](7A-4:30P)  02/22/2015 8:25 AM

## 2015-02-23 MED ORDER — OXYCODONE HCL 5 MG PO TABS: 5.0000 mg | ORAL_TABLET | Freq: Four times a day (QID) | ORAL | Status: AC | PRN

## 2015-02-23 MED ORDER — AMOXICILLIN-POT CLAVULANATE 875-125 MG PO TABS: 1.0000 | ORAL_TABLET | Freq: Two times a day (BID) | ORAL | Status: AC

## 2015-02-23 NOTE — Discharge Summary (Signed)
Physician Discharge Summary  Dale Crane EXB:[id number] DOB: 1985/05/09 DOA: 02/15/2015  PCP: No PCP Per Patient  Consultation: IR  Admit date: 02/15/2015 Discharge date: 02/23/2015  Recommendations for Outpatient Follow-up:    Follow-up Information    Follow up with [facility] E, MD In 2 weeks.   Specialty:  General Surgery   Why:  hospital follow up abscess   Contact information:   [address] [place] [address] [phone]      Discharge Diagnoses:  1. Intra-abdominal abscesses   Surgical Procedure: IR drain placement 02/15/15  Discharge Condition: stable  Disposition: home  Diet recommendation: regular  Filed Weights   02/15/15 1241  Weight: 84.1 kg (185 lb 6.5 oz)     Filed Vitals:   02/22/15 2047 02/23/15 0518  BP: 109/59 104/61  Pulse: 96 75  Temp: 98.3 F (36.8 C) 97.6 F (36.4 C)  Resp: 18 18     Hospital Course:  Dale Crane is well known to our service followed perforated appendicitis in November complicated bu multiple intra abdominal abscesses requiring drains and multiple admissions.  He was admitted again with an abscess.  Had a drain placed and placed on IV antibiotics.  Repeat CT on 1/26 showed resolution of abscess and drain was injected and removed.  He still had a small unchanged mesenteric fluid collection and therefore was sent on additional 7 days of Augmentin.  We had a discussion regarding results and home care via telephone interpreter.  The patient verbalizes understanding.  Follow up with Dr. Janee Morn 1-2 weeks.  Physical Exam: General: Pt awake/alert/oriented x4 in no acute distress Abdomen: Soft. Nondistended. Mild ttp llq. No evidence of peritonitis. No incarcerated hernias.    Discharge Instructions     Medication List    TAKE these medications        amoxicillin-clavulanate 875-125 MG tablet  Commonly known as:  AUGMENTIN  Take 1 tablet by mouth 2 (two) times daily.     docusate  sodium 100 MG capsule  Commonly known as:  COLACE  Take 1 capsule (100 mg total) by mouth 2 (two) times daily.     oxyCODONE 5 MG immediate release tablet  Commonly known as:  Oxy IR/ROXICODONE  Take 1-2 tablets (5-10 mg total) by mouth every 6 (six) hours as needed for moderate pain or severe pain.     saccharomyces boulardii 250 MG capsule  Commonly known as:  FLORASTOR  Take 1 capsule (250 mg total) by mouth 2 (two) times daily.           Follow-up Information    Follow up with [facility] E, MD In 2 weeks.   Specialty:  General Surgery   Why:  hospital follow up abscess   Contact information:   [address] [place] [address] [phone]        The results of significant diagnostics from this hospitalization (including imaging, microbiology, ancillary and laboratory) are listed below for reference.    Significant Diagnostic Studies: Ct Abdomen W Contrast  01/29/2015  CLINICAL DATA:  Perforated appendicitis status post laparoscopic appendectomy on 12/13/2014, complicated by perihepatic abscess requiring percutaneous drainage, presenting for follow-up. EXAM: CT ABDOMEN WITH CONTRAST TECHNIQUE: Multidetector CT imaging of the abdomen was performed using the standard protocol following bolus administration of intravenous contrast. CONTRAST:  ISOVUE-300 IOPAMIDOL (ISOVUE-300) INJECTION 61% COMPARISON:  01/08/2015 CT abdomen/ pelvis. FINDINGS: Lower chest: Anterior right middle lobe subpleural 3 mm pulmonary nodule (series 5/image 4) is  stable since 12/13/2014. Two tiny left lower lobe solid pulmonary nodules, largest 4 mm (series 5/image 80), stable since 12/13/2014. Hepatobiliary: Normal liver with no liver mass. Normal gallbladder with no radiopaque cholelithiasis. No biliary ductal dilatation. Pancreas: Normal, with no mass or duct dilation. Spleen: Normal size. No mass. Adrenals/Urinary Tract: Normal adrenals. A punctate 1 mm density in the anterior lower  right renal collecting system could represent a tiny nonobstructing stone versus early contrast excretion. Otherwise normal kidneys, with no renal mass and no hydronephrosis. Stomach/Bowel: Grossly normal stomach. Visualized small and large bowel is normal caliber, with no bowel wall thickening. The patient is status post appendectomy. There has been interval removal of the surgical drain at the appendectomy bed. No recurrent fluid collection at the appendectomy bed. Vascular/Lymphatic: Normal caliber abdominal aorta. Patent portal, splenic, hepatic and renal veins. No pathologically enlarged lymph nodes in the abdomen. Other: Lateral right upper abdominal approach transhepatic percutaneous pigtail drainage catheter terminates in the posterior inferior right perihepatic space. No residual or new fluid collection is seen in the perihepatic space. There is a persistent calcified 14 mm appendicolith in the posterior inferior right perihepatic space adjacent to the pigtail drainage catheter tip. No pneumoperitoneum or new intra-abdominal fluid collections. Ascites. Musculoskeletal: No aggressive appearing focal osseous lesions. IMPRESSION: 1. No residual perihepatic fluid collection. Persistent appendicolith adjacent to the pigtail drainage catheter in the posterior inferior perihepatic space. 2. Interval removal of the surgical drain from the appendectomy bed, with no fluid collection in the surgical bed. 3. Tiny pulmonary nodules at the lung bases, largest 4 mm, for which 1 month stability has been demonstrated. If the patient is at high risk for bronchogenic carcinoma, follow-up chest CT at 1 year is recommended. If the patient is at low risk, no follow-up is needed. This recommendation follows the consensus statement: Guidelines for Management of Small Pulmonary Nodules Detected on CT Scans: A Statement from the Fleischner Society as published in Radiology 2005; 237:395-400. Electronically Signed   By: Delbert Phenix  M.D.   On: 01/29/2015 10:17   Ct Abdomen Pelvis W Contrast  02/22/2015  CLINICAL DATA:  Abdominal abscess EXAM: CT ABDOMEN AND PELVIS WITH CONTRAST TECHNIQUE: Multidetector CT imaging of the abdomen and pelvis was performed using the standard protocol following bolus administration of intravenous contrast. CONTRAST:  OMNIPAQUE IOHEXOL 300 MG/ML  SOLN COMPARISON:  02/15/2015 FINDINGS: Liver, spleen, gallbladder, pancreas, and adrenal glands are within normal limits. Kidneys are within normal limits. Surgical drain in the right pericolic gutter has been removed. A calcific density remains in the lower right pericolic gutter. Inflammatory changes persist about this calcification. This may represent an appendicolith. 10 mm soft tissue density in the anterior mid mesenteric on image 50 is not significantly changed. There is a small extraluminal gas bubble within this area. Previously, this was characterized as a tiny abscess. A surgical drain has been placed into the left pelvic abscess. The abscess cavity is completely decompressed. The drain remains in place. Inflammatory changes of the bladder have improved. Inflammatory changes in the adjacent fat and adjacent sigmoid colon have improved. There is some thickening of the left rectus muscle likely due to inflammation. No evidence of new abscess. IMPRESSION: Status post drainage of the large left pelvic abscess. The abscess has resolved. Adjacent inflammatory changes have improved. Consider abscess strain injection to evaluate for fistula. Tiny mesenteric abscess is not significantly changed. It is primarily now characterized by inflammatory tissue rather than fluid. Right pericolic gutter surgical drain has  been removed. Minimal inflammatory changes persist. A calcification persists worrisome for appendicolith. Electronically Signed   By: Jolaine Click M.D.   On: 02/22/2015 07:49   Ct Abdomen Pelvis W Contrast  02/15/2015  CLINICAL DATA:  Acute onset of  generalized abdominal pain, nausea and vomiting. Constipation. Initial encounter. EXAM: CT ABDOMEN AND PELVIS WITH CONTRAST TECHNIQUE: Multidetector CT imaging of the abdomen and pelvis was performed using the standard protocol following bolus administration of intravenous contrast. CONTRAST:  OMNIPAQUE IOHEXOL 300 MG/ML  SOLN COMPARISON:  CT of the abdomen and pelvis from 02/02/2015 FINDINGS: Mild bibasilar atelectasis is noted. The previously noted complex abscess within the right posterior inferior perihepatic space has resolved, with associated drainage catheter in place. Trace residual fluid and apparent residual appendicolith are noted along the right paracolic gutter. There is a new 6.5 x 5.8 x 4.4 cm peripherally enhancing abscess within the upper pelvis, with surrounding soft tissue inflammation tracking about adjacent small and large bowel loops. Additional trace peripherally enhancing fluid collections are noted tracking anteriorly, superior to the bladder. Trace fluid is noted tracking superiorly along the mesentery. There also appears to be a small 1.2 cm mesenteric abscess at the mid abdomen, at the level of the kidneys, containing fluid and trace air. The liver and spleen are unremarkable in appearance. The gallbladder is within normal limits. The pancreas and adrenal glands are unremarkable. A 2 mm nonobstructing stone is noted near the lower pole of the right kidney. Trace right-sided perinephric fluid is noted. The kidneys are otherwise unremarkable. There is no evidence of hydronephrosis. No obstructing ureteral stones are seen. The more proximal small bowel is grossly unremarkable. The stomach is within normal limits. No acute vascular abnormalities are seen. The colon is otherwise unremarkable in appearance. The bladder is mildly distended. Mild surrounding soft tissue inflammation reflects the adjacent infectious process. The prostate is normal in size. No inguinal lymphadenopathy is  seen. No acute osseous abnormalities are identified. IMPRESSION: 1. New 6.5 x 5.8 x 4.4 cm peripherally enhancing abscess within the upper pelvis, with surrounding soft tissue inflammation tracking about adjacent small and large bowel loops. Additional trace peripherally enhancing fluid collections noted tracking anteriorly, superior to the bladder. Given its central location, this may be difficult to drain. 2. Small 1.2 cm mesenteric abscess noted at the mid abdomen, at the level of the kidneys, containing fluid and trace air. 3. Trace fluid tracks superiorly along the mesentery. 4. Previously noted complex abscess within the right posterior inferior perihepatic space has resolved, with associated drainage catheter in place. Trace residual fluid and apparent residual appendicolith noted along the right paracolic gutter. 5. 2 mm nonobstructing stone near the lower pole of the right kidney. 6. Mild soft tissue inflammation about the bladder reflects the adjacent infectious process. These results were called by telephone at the time of interpretation on 02/15/2015 at 1:49 am to Dr. Jaci Carrel, who verbally acknowledged these results. Electronically Signed   By: Roanna Raider M.D.   On: 02/15/2015 01:52   Ct Abdomen Pelvis W Contrast  02/02/2015  CLINICAL DATA:  Perforated appendicitis in November 2016 complicated by right upper quadrant abscess requiring percutaneous drainage, with removal of drain on 01/29/2015. Recurrent right flank pain. EXAM: CT ABDOMEN AND PELVIS WITH CONTRAST TECHNIQUE: Multidetector CT imaging of the abdomen and pelvis was performed using the standard protocol following bolus administration of intravenous contrast. CONTRAST:  OMNIPAQUE IOHEXOL 300 MG/ML  SOLN COMPARISON:  01/29/2015 CT abdomen/ pelvis. FINDINGS: Lower chest:  Anterior subpleural right middle lobe 3 mm pulmonary nodule (series 3/ image 11), stable since 12/13/2014. Subpleural 5 mm plaque associated with the minor  fissure (series 3/image 4), stable since 12/13/2014. Left lower lobe 4 mm pulmonary nodule (series 3/image 14), stable since 12/13/2014. New trace right pleural effusion with bilateral lower lobe atelectasis, right greater than left. Hepatobiliary: There is a recurrent thick walled 7.2 x 3.9 x 5.7 cm irregular gas and fluid containing abscess (series 2/ image 41) in the right posterior inferior perihepatic space surrounding the 15 mm appendicolith, which demonstrates mass effect on segment 6 of the right liver lobe. There is new prominent fat stranding surrounding the abscess. Otherwise normal liver. Normal gallbladder with no radiopaque cholelithiasis. No biliary ductal dilatation. Pancreas: Normal, with no mass or duct dilation. Spleen: Normal size. No mass. Adrenals/Urinary Tract: Normal adrenals. Normal kidneys with no hydronephrosis and no renal mass. Normal bladder. Stomach/Bowel: Grossly normal stomach. Normal caliber small bowel with no small bowel wall thickening. The patient is status post appendectomy. There is in increased small amount of free gas in the right lower quadrant adjacent to the appendectomy site (series 2/ image 60). There is an additional tiny 1.6 x 0.9 cm thick-walled collection in the medial right mesentery anterior to the right common iliac artery (series 2/ image 71), which surrounds a tiny 4 mm calcification that could represent an additional appendicolith fragment. Normal large bowel with no diverticulosis, large bowel wall thickening or pericolonic fat stranding. Vascular/Lymphatic: Normal caliber abdominal aorta. Patent portal, splenic, hepatic and renal veins. No pathologically enlarged lymph nodes in the abdomen or pelvis. Reproductive: Normal size prostate and seminal vesicles. Nonspecific faint internal prostatic calcifications, unchanged. Other: No ascites . Musculoskeletal: No aggressive appearing focal osseous lesions. IMPRESSION: 1. Recurrent 7.2 cm abscess in the right  posterior inferior perihepatic space surrounding a 15 mm appendicolith. 2. Small amount of increased extraluminal gas in the right lower quadrant adjacent to the appendectomy site, cannot exclude a dehiscence at the appendectomy site. 3. Additional tiny 1.6 cm thick-walled collection in the medial right mesentery surrounding a tiny 4 mm calcification, suggestive of an additional appendicolith fragment with surrounding infection. 4. New trace right pleural effusion. 5. Tiny pulmonary nodules at the lung bases, largest 4 mm in the left lower lobe, for which 1 month stability has been demonstrated. If the patient is at high risk for bronchogenic carcinoma, follow-up chest CT at 1 year is recommended. If the patient is at low risk, no follow-up is needed. This recommendation follows the consensus statement: Guidelines for Management of Small Pulmonary Nodules Detected on CT Scans: A Statement from the Fleischner Society as published in Radiology 2005; 237:395-400. Electronically Signed   By: Delbert Phenix M.D.   On: 02/02/2015 12:21   Ir Sinus/fist Tube Chk-non Gi  02/22/2015  INDICATION: Pelvic abscess, status post percutaneous drainage. Follow-up CT demonstrates resolution. EXAM: SINUS TRACT INJECTION/FISTULOGRAM MEDICATIONS: The patient is currently admitted to the hospital and receiving intravenous antibiotics. The antibiotics were administered within an appropriate time frame prior to the initiation of the procedure. ANESTHESIA/SEDATION: None. COMPLICATIONS: None immediate. PROCEDURE: Under sterile conditions, the existing left lower quadrant pelvic abscess drain was injected with contrast under fluoroscopy. Images obtained. Contrast injection demonstrates a collapsed abscess cavity. No definite fistula to adjacent colon. Contrast was aspirated by syringe aspiration. Catheter was cut and removed without difficulty. Sterile dressing applied. No immediate complication. IMPRESSION: Negative for fistula to the colon.   Resolved abscess. Drain was removed. Electronically Signed  By: Osvaldo Shipper M.D.   On: 02/22/2015 17:18   Ct Image Guided Drainage By Percutaneous Catheter  02/15/2015  CLINICAL DATA:  Status post appendectomy with additional surgical removal of retained appendicolith. Development of new postoperative abscess in the left lower pelvis. EXAM: CT GUIDED DRAINAGE OF PELVIC PERITONEAL ABSCESS ANESTHESIA/SEDATION: 3.0 Mg IV Versed 150 mcg IV Fentanyl Total Moderate Sedation Time:  35 minutes. COMPARISON:  CT of the abdomen and pelvis performed earlier today. PROCEDURE: The procedure, risks, benefits, and alternatives were explained to the patient. Questions regarding the procedure were encouraged and answered. The patient understands and consents to the procedure. The left lower abdominal wall was prepped with chlorhexidine in a sterile fashion, and a sterile drape was applied covering the operative field. A sterile gown and sterile gloves were used for the procedure. Local anesthesia was provided with 1% Lidocaine. CT of the pelvis was performed in a supine position. Prior to the procedure a Foley catheter was placed to decompress the bladder. Under CT guidance, an 18 gauge trocar needle was advanced into the left pelvic abscess from an anterior approach. A fluid sample was aspirated and sent for culture analysis. A guidewire was advanced into the collection. A 10 French percutaneous drainage catheter was then advanced over the wire. Catheter positioning was confirmed by CT. The catheter was then flushed and connected to a suction bulb. The catheter was secured at the skin with a Prolene retention suture and StatLock device. COMPLICATIONS: None FINDINGS: The lower pelvic abscess located just to the left of midline was accessed from a left anterior approach. Aspiration yielded bloody purulent fluid. After drain placement, there is excellent return of fluid from the abscess. IMPRESSION: CT-guided percutaneous drainage  of left pelvic abscess located in the peritoneal cavity. Bloody purulent fluid was aspirated with a sample sent for culture analysis. A 10 French drain was placed and attached to suction bulb drainage. Electronically Signed   By: Irish Lack M.D.   On: 02/15/2015 17:25    Microbiology: Recent Results (from the past 240 hour(s))  Culture, routine-abscess     Status: None   Collection Time: 02/15/15  3:29 PM  Result Value Ref Range Status   Specimen Description ABSCESS PELVIS  Final   Special Requests Normal  Final   Gram Stain   Final    ABUNDANT WBC PRESENT,BOTH PMN AND MONONUCLEAR NO SQUAMOUS EPITHELIAL CELLS SEEN ABUNDANT GRAM POSITIVE COCCI IN PAIRS IN CLUSTERS ABUNDANT GRAM POSITIVE RODS ABUNDANT GRAM NEGATIVE RODS    Culture   Final    MICROAEROPHILIC STREPTOCOCCI Note: Standardized susceptibility testing for this organism is not available. Performed at [facility]    Report Status 02/18/2015 FINAL  Final  Anaerobic culture     Status: None   Collection Time: 02/15/15  3:31 PM  Result Value Ref Range Status   Specimen Description ABSCESS PELVIS  Final   Special Requests Normal  Final   Gram Stain   Final    ABUNDANT WBC PRESENT,BOTH PMN AND MONONUCLEAR NO SQUAMOUS EPITHELIAL CELLS SEEN ABUNDANT GRAM POSITIVE COCCI IN PAIRS IN CLUSTERS ABUNDANT GRAM POSITIVE RODS ABUNDANT GRAM NEGATIVE RODS    Culture   Final    BACTEROIDES FRAGILIS Note: BETA LACTAMASE POSITIVE Performed at [facility]    Report Status 02/20/2015 FINAL  Final     Labs: Basic Metabolic Panel:  Recent Labs Lab 02/21/15 0342 02/22/15 0436  NA 139  --   K 4.1  --   CL 102  --  CO2 29  --   GLUCOSE 105*  --   BUN 10  --   CREATININE 0.77 0.85  CALCIUM 9.0  --    Liver Function Tests:  Recent Labs Lab 02/21/15 0342  AST 28  ALT 49  ALKPHOS 67  BILITOT 0.3  PROT 7.2  ALBUMIN 3.1*   No results for input(s): LIPASE, AMYLASE in the last 168 hours. No results  for input(s): AMMONIA in the last 168 hours. CBC:  Recent Labs Lab 02/21/15 0342  WBC 8.5  HGB 12.3*  HCT 37.4*  MCV 79.2  PLT 465*   Cardiac Enzymes: No results for input(s): CKTOTAL, CKMB, CKMBINDEX, TROPONINI in the last 168 hours. BNP: BNP (last 3 results) No results for input(s): BNP in the last 8760 hours.  ProBNP (last 3 results) No results for input(s): PROBNP in the last 8760 hours.  CBG: No results for input(s): GLUCAP in the last 168 hours.  Active Problems:   Intra-abdominal abscess (HCC)   Time coordinating discharge: <30 mins   Signed:  Birgitta Uhlir, ANP-BC

## 2015-02-23 NOTE — Progress Notes (Signed)
Interpretor used to give pt discharge instruction, appointments, medication & prep for future appointment all reviewed thoroughly with interpretor service.

## 2015-03-05 ENCOUNTER — Other Ambulatory Visit: Payer: Self-pay

## 2015-03-05 ENCOUNTER — Ambulatory Visit
Admission: RE | Admit: 2015-03-05 | Discharge: 2015-03-05 | Disposition: A | Discharge status: Home / Self Care | Payer: No Typology Code available for payment source | Source: Ambulatory Visit

## 2015-03-05 ENCOUNTER — Ambulatory Visit
Admission: RE | Admit: 2015-03-05 | Discharge: 2015-03-05 | Disposition: A | Discharge status: Home / Self Care | Payer: No Typology Code available for payment source | Source: Ambulatory Visit | Attending: General Surgery | Admitting: General Surgery

## 2015-03-05 ENCOUNTER — Inpatient Hospital Stay: Admit: 2015-03-05 | Payer: Self-pay

## 2015-03-05 DIAGNOSIS — IMO0001 Reserved for inherently not codable concepts without codable children: Secondary | ICD-10-CM

## 2015-03-05 DIAGNOSIS — T814XXD Infection following a procedure, subsequent encounter: Principal | ICD-10-CM

## 2015-03-05 DIAGNOSIS — K651 Peritoneal abscess: Principal | ICD-10-CM

## 2015-03-06 ENCOUNTER — Other Ambulatory Visit: Payer: Self-pay

## 2017-02-25 IMAGING — CT CT ABD-PELV W/ CM
2 of 4 series · 15 of 46 positions shown, 17 images · IV contrast (APPLIED)
Comparison: 12/13/2014

CLINICAL DATA: Persistent mid abdominal pain, status post ruptured
appendicitis

EXAM:
CT ABDOMEN AND PELVIS WITH CONTRAST
TECHNIQUE: Multidetector CT imaging of the abdomen and pelvis was performed
using the standard protocol following bolus administration of
intravenous contrast.
CONTRAST:  100mL OMNIPAQUE IOHEXOL 300 MG/ML  SOLN

[Series 2: abd/ pelvis 5.0 i30f 1 · axial · 0.73mm/px · z∈[-549,-84]mm · 12 of 111 slices shown, 14 images]
[im 9/111  soft-tissue]
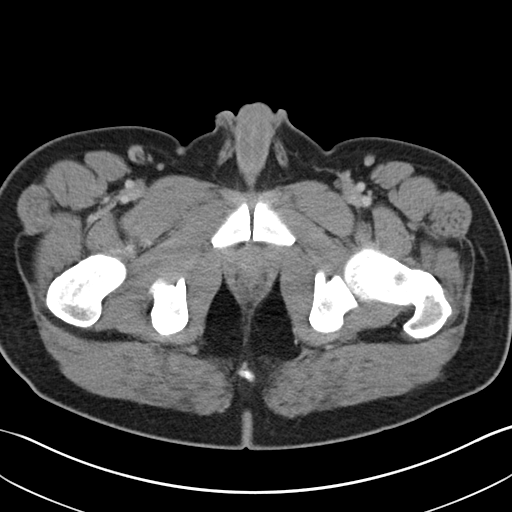
[im 9/111  bone]
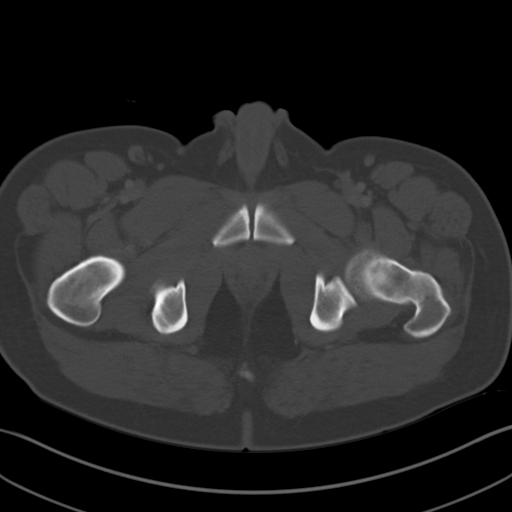
[im 17/111  soft-tissue]
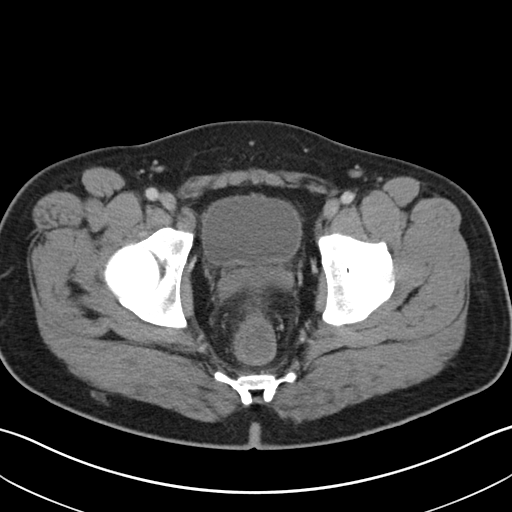
[im 26/111  soft-tissue]
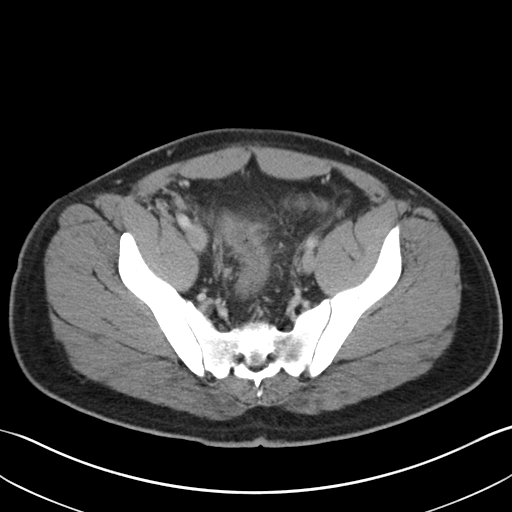
[im 34/111  soft-tissue]
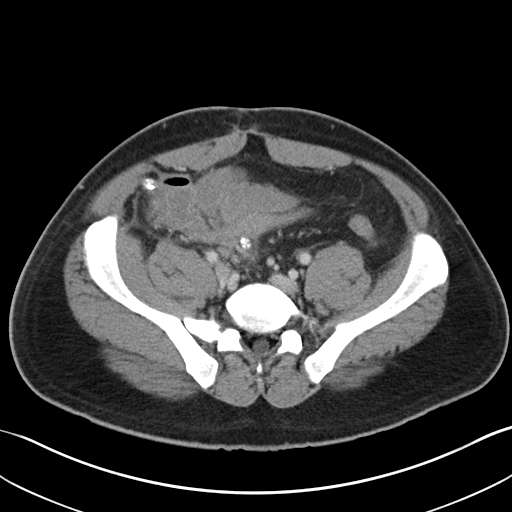
[im 43/111  soft-tissue]
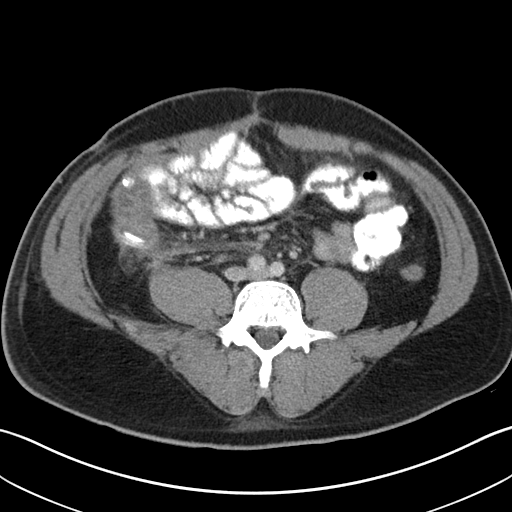
[im 51/111  soft-tissue]
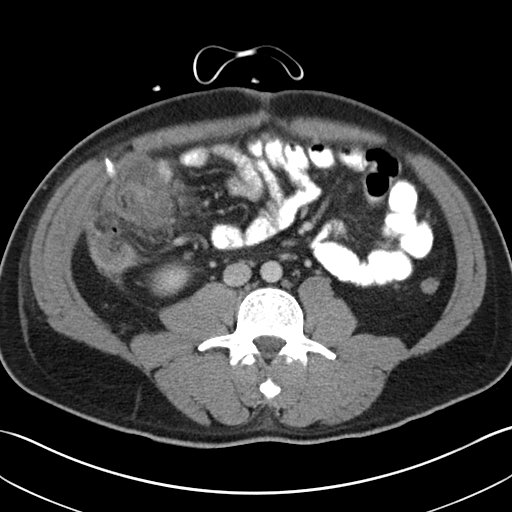
[im 60/111  soft-tissue]
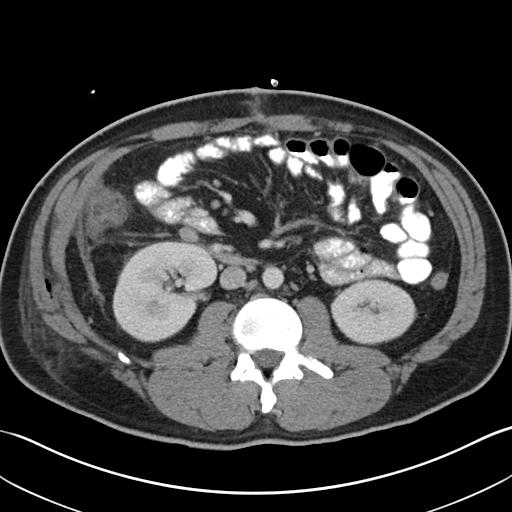
[im 68/111  soft-tissue]
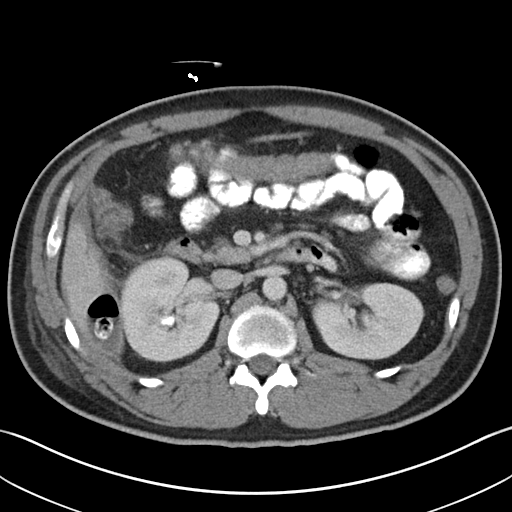
[im 77/111  soft-tissue]
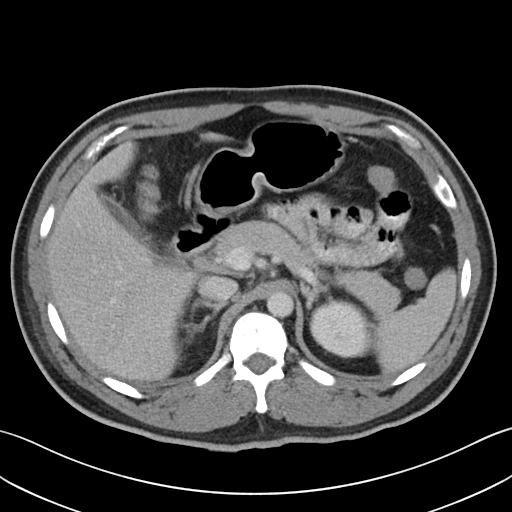
[im 77/111  bone]
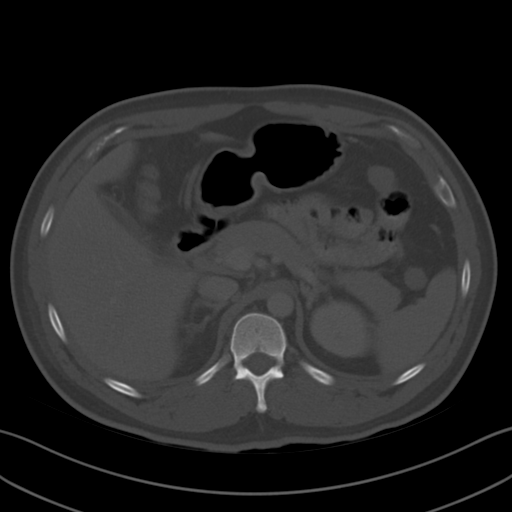
[im 85/111  soft-tissue]
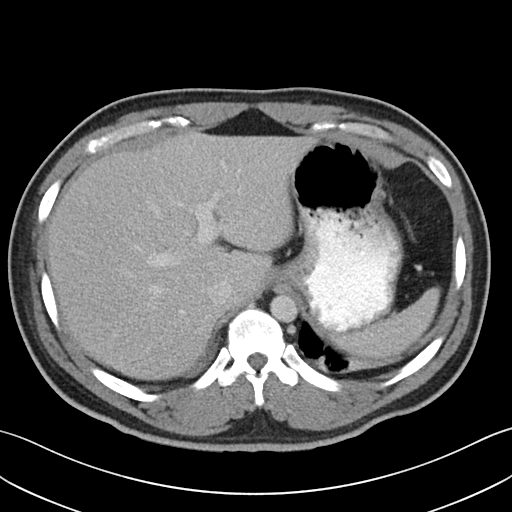
[im 94/111  soft-tissue]
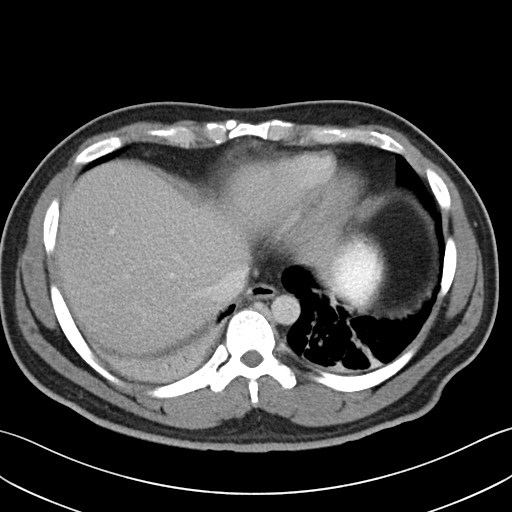
[im 102/111  soft-tissue]
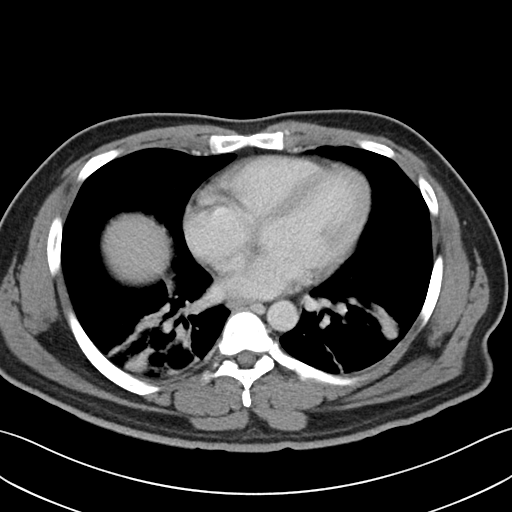

[Series 5: coronal soft tissue · coronal · 0.79mm/px · 3 of 84 slices shown]
[im 28/84  soft-tissue]
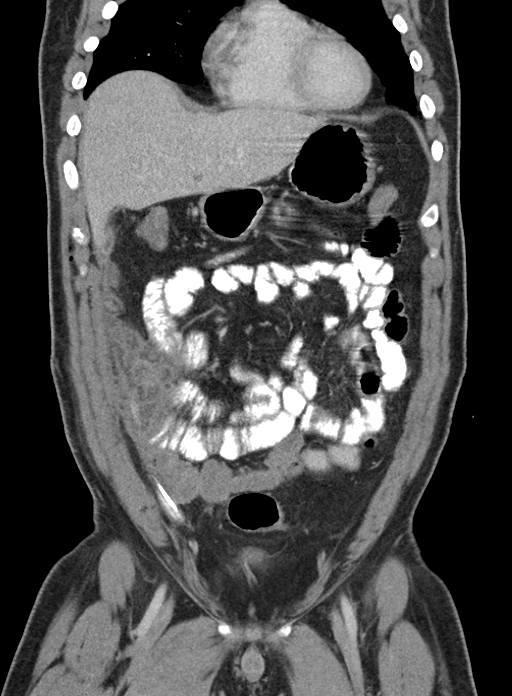
[im 37/84  soft-tissue]
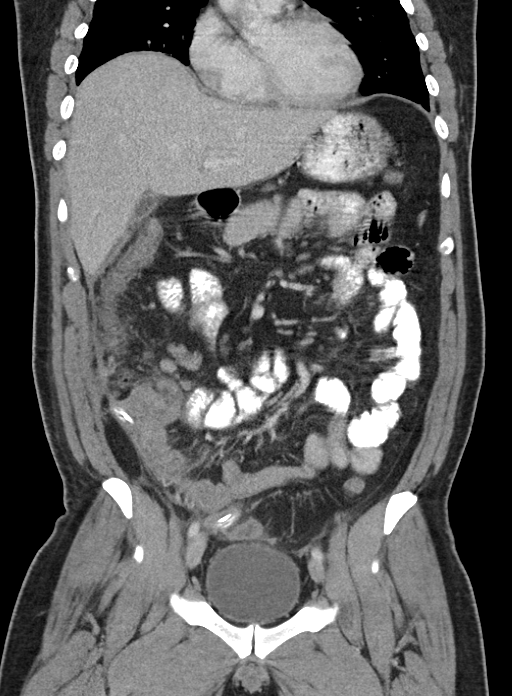
[im 47/84  soft-tissue]
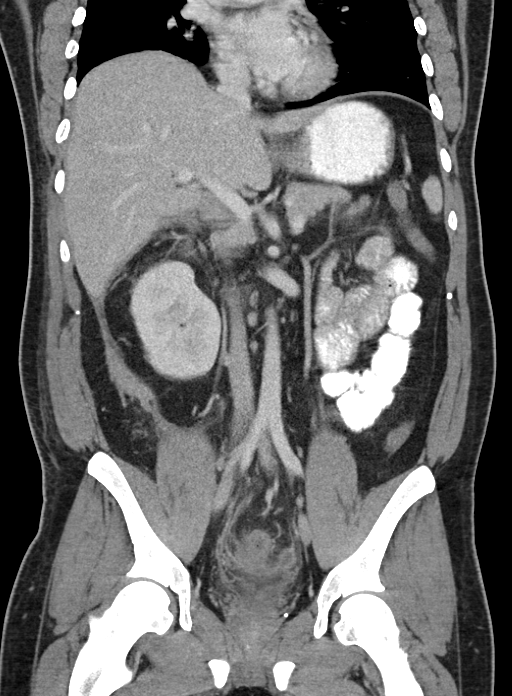

[15 of 46 positions shown; findings below may reference images not displayed]

FINDINGS: There is atelectasis or infiltrate in right lower lobe posteriorly
with some air bronchogram. Contracted gallbladder is noted. Small
amount of perihepatic inferior ascites. There is a collection
adjacent to inferior aspect of the liver with air-fluid level and
high-density material centrally probable migrated appendicolith.
This is highly suspicious of subcapsular abscess measures 4.6 by
cm.

There is small amount of fluid in right paracolic gutter. Persistent
mild inflammatory changes in right lower quadrant post rupture
appendicitis. A percutaneous drain is noted in right lower quadrant.
Small residual abscess in right lower quadrant measures 2.5 by
cm. Small probable reactive lymph nodes are noted in right lower
quadrant mesentery. There is no evidence of small bowel obstruction.

Kidneys are symmetrical in size and enhancement. No hydronephrosis
or hydroureter.

Small amount of air probable post instrumentation noted within
urinary bladder. There is a small collection within pelvis just
posterior with urinary bladder with enhancing wall measures 4.3 by 3
cm suspicious for abscess.

Axial image 43 there is small amount of soft tissue air within right
flank wall. This is probable post surgical in nature. Please see
also axial image 53.
IMPRESSION: 1. There is a collection adjacent to inferior aspect of the liver
with air-fluid level and high-density material centrally probable
migrated appendicolith. This is highly suspicious of subcapsular
abscess measures 4.6 by 3.1 cm.
2. Persistent inflammatory changes in right lower quadrant post
ruptured appendicitis. A percutaneous drain is noted in right lower
quadrant. Small residual abscess in right lower quadrant measures
2.5 x 2.8 cm. Reactive mesenteric lymph nodes are noted in right
lower quadrant.
3. There is a pelvic collection just posterior to urinary bladder
with enhancing wall measures 4.3 x 3 cm suspicious for pelvic
abscess.
4. No small bowel obstruction.
5. No hydronephrosis or hydroureter.
